# Patient Record
Sex: Female | Born: 1970 | Race: Black or African American | Hispanic: No | Marital: Single | Smoking: Current some day smoker
Health system: Southern US, Community
[De-identification: ages and names within clinical notes are randomized; demographics above are authoritative.]

## PROBLEM LIST (undated history)

## (undated) DIAGNOSIS — I10 Essential (primary) hypertension: Secondary | ICD-10-CM

## (undated) DIAGNOSIS — R519 Headache, unspecified: Secondary | ICD-10-CM

## (undated) DIAGNOSIS — R51 Headache: Secondary | ICD-10-CM

## (undated) HISTORY — DX: Essential (primary) hypertension: I10

## (undated) HISTORY — PX: FOOT SURGERY: SHX648

## (undated) HISTORY — DX: Headache, unspecified: R51.9

## (undated) HISTORY — PX: TUBAL LIGATION: SHX77

## (undated) HISTORY — DX: Headache: R51

---

## 2009-01-26 ENCOUNTER — Emergency Department (HOSPITAL_COMMUNITY): Admission: EM | Admit: 2009-01-26 | Discharge: 2009-01-26 | Payer: Self-pay | Admitting: Family Medicine

## 2009-03-09 ENCOUNTER — Observation Stay (HOSPITAL_COMMUNITY): Admission: EM | Admit: 2009-03-09 | Discharge: 2009-03-10 | Payer: Self-pay | Admitting: Emergency Medicine

## 2009-03-12 ENCOUNTER — Emergency Department (HOSPITAL_COMMUNITY): Admission: EM | Admit: 2009-03-12 | Discharge: 2009-03-12 | Payer: Self-pay | Admitting: Emergency Medicine

## 2009-03-23 ENCOUNTER — Emergency Department (HOSPITAL_COMMUNITY): Admission: EM | Admit: 2009-03-23 | Discharge: 2009-03-23 | Payer: Self-pay | Admitting: Emergency Medicine

## 2009-11-03 ENCOUNTER — Ambulatory Visit (HOSPITAL_COMMUNITY): Admission: RE | Admit: 2009-11-03 | Discharge: 2009-11-03 | Payer: Self-pay | Admitting: Obstetrics and Gynecology

## 2010-01-26 ENCOUNTER — Emergency Department (HOSPITAL_COMMUNITY): Admission: EM | Admit: 2010-01-26 | Discharge: 2010-01-26 | Payer: Self-pay | Admitting: Family Medicine

## 2010-02-17 ENCOUNTER — Emergency Department (HOSPITAL_COMMUNITY): Admission: EM | Admit: 2010-02-17 | Discharge: 2010-02-17 | Payer: Self-pay | Admitting: Emergency Medicine

## 2010-02-22 ENCOUNTER — Emergency Department (HOSPITAL_COMMUNITY): Admission: EM | Admit: 2010-02-22 | Discharge: 2010-02-23 | Payer: Self-pay | Admitting: Emergency Medicine

## 2011-02-21 LAB — WET PREP, GENITAL
Trich, Wet Prep: NONE SEEN
Yeast Wet Prep HPF POC: NONE SEEN

## 2011-02-21 LAB — GC/CHLAMYDIA PROBE AMP, GENITAL: GC Probe Amp, Genital: NEGATIVE

## 2011-02-21 LAB — URINALYSIS, ROUTINE W REFLEX MICROSCOPIC
Nitrite: NEGATIVE
Protein, ur: NEGATIVE mg/dL
pH: 7.5 (ref 5.0–8.0)

## 2011-02-21 LAB — URINE MICROSCOPIC-ADD ON

## 2011-03-01 LAB — CBC
HCT: 42.8 % (ref 36.0–46.0)
MCHC: 33.3 g/dL (ref 30.0–36.0)
MCV: 94.9 fL (ref 78.0–100.0)
RBC: 4.51 MIL/uL (ref 3.87–5.11)

## 2011-03-01 LAB — PREGNANCY, URINE: Preg Test, Ur: NEGATIVE

## 2011-03-09 LAB — POCT CARDIAC MARKERS
Myoglobin, poc: 58.7 ng/mL (ref 12–200)
Troponin i, poc: <0.05 ng/mL (ref 0.00–0.09)

## 2011-03-09 LAB — D-DIMER, QUANTITATIVE: D-Dimer, Quant: 0.47 ug/mL-FEU (ref 0.00–0.48)

## 2011-03-10 LAB — POCT URINALYSIS DIP (DEVICE): Bilirubin Urine: NEGATIVE

## 2011-03-10 LAB — POCT PREGNANCY, URINE: Preg Test, Ur: NEGATIVE

## 2012-01-03 ENCOUNTER — Other Ambulatory Visit: Payer: Self-pay | Admitting: Internal Medicine

## 2012-01-03 DIAGNOSIS — E049 Nontoxic goiter, unspecified: Secondary | ICD-10-CM

## 2012-01-06 ENCOUNTER — Ambulatory Visit
Admission: RE | Admit: 2012-01-06 | Discharge: 2012-01-06 | Disposition: A | Discharge status: Home / Self Care | Payer: Medicaid Other | Source: Ambulatory Visit | Attending: Internal Medicine | Admitting: Internal Medicine

## 2012-01-06 DIAGNOSIS — E049 Nontoxic goiter, unspecified: Secondary | ICD-10-CM

## 2013-05-11 ENCOUNTER — Emergency Department (HOSPITAL_COMMUNITY)
Admission: EM | Admit: 2013-05-11 | Discharge: 2013-05-11 | Disposition: A | Discharge status: Home / Self Care | Payer: Medicaid Other | Attending: Emergency Medicine | Admitting: Emergency Medicine

## 2013-05-11 ENCOUNTER — Encounter (HOSPITAL_COMMUNITY): Payer: Self-pay | Admitting: Emergency Medicine

## 2013-05-11 DIAGNOSIS — L299 Pruritus, unspecified: Secondary | ICD-10-CM | POA: Insufficient documentation

## 2013-05-11 DIAGNOSIS — R21 Rash and other nonspecific skin eruption: Secondary | ICD-10-CM

## 2013-05-11 DIAGNOSIS — J3489 Other specified disorders of nose and nasal sinuses: Secondary | ICD-10-CM | POA: Insufficient documentation

## 2013-05-11 DIAGNOSIS — F17200 Nicotine dependence, unspecified, uncomplicated: Secondary | ICD-10-CM | POA: Insufficient documentation

## 2013-05-11 MED ORDER — HYDROCORTISONE 1 % EX CREA: TOPICAL_CREAM | Freq: Two times a day (BID) | CUTANEOUS | Status: DC

## 2013-05-11 MED ORDER — MUPIROCIN CALCIUM 2 % EX CREA: TOPICAL_CREAM | Freq: Three times a day (TID) | CUTANEOUS | Status: DC

## 2013-05-11 NOTE — ED Provider Notes (Signed)
History     CSN: [id number]  Arrival date & time 05/11/13  8657   First MD Initiated Contact with Patient 05/11/13 [phone]      Chief Complaint  Patient presents with  . Rash    (Consider location/radiation/quality/duration/timing/severity/associated sxs/prior treatment) HPI Comments: Patient is a 42 year old female who presents today with a pruritic rash since Memorial Day. The rash in on the dorsal aspect of her right hand and has been worsening. She states it initially looked like ringworm and she was around one of her niece's who's sister had ringworm. She self treated with an antifungal cream. She initially was improving, but 2 days ago the rash increased in size and became more itchy. She has been itching at it, putting band aids over it, and using distraction techniques all without improvement. She has a small area that is itchy on her upper left arm and upper right arm as well. She admits to congestion. No fevers, chills, nausea, vomiting, abdominal pain, arthralgias.   The history is provided by the patient. No language interpreter was used.    History reviewed. No pertinent past medical history.  Past Surgical History  Procedure Laterality Date  . Tubal ligation      No family history on file.  History  Substance Use Topics  . Smoking status: Current Every Day Smoker  . Smokeless tobacco: Not on file  . Alcohol Use: Yes     Comment: occasional    OB History   Grav Para Term Preterm Abortions TAB SAB Ect Mult Living                  Review of Systems  Constitutional: Negative for fever and chills.  HENT: Positive for congestion.   Gastrointestinal: Negative for vomiting and abdominal pain.  Musculoskeletal: Negative for joint swelling, arthralgias and gait problem.  Skin: Positive for rash.  All other systems reviewed and are negative.    Allergies  Morphine and related  Home Medications  No current outpatient prescriptions on file.  BP 132/82  Pulse  109  Temp(Src) 98.4 F (36.9 C) (Oral)  Resp 20  SpO2 100%  Physical Exam  Nursing note and vitals reviewed. Constitutional: She is oriented to person, place, and time. She appears well-developed and well-nourished. No distress.  HENT:  Head: Normocephalic and atraumatic.  Right Ear: External ear and ear canal normal.  Left Ear: Tympanic membrane, external ear and ear canal normal.  Nose: Nose normal.  Mouth/Throat: Oropharynx is clear and moist.  Eyes: Conjunctivae are normal.  Neck: Normal range of motion.  Cardiovascular: Normal rate, regular rhythm and normal heart sounds.   Pulmonary/Chest: Effort normal and breath sounds normal. No stridor. No respiratory distress. She has no wheezes. She has no rales.  Abdominal: Soft. She exhibits no distension.  Musculoskeletal: Normal range of motion.  Neurological: She is alert and oriented to person, place, and time. She has normal strength.  Skin: Skin is warm and dry. She is not diaphoretic. No erythema.  2 cm circular area of raw excortication with surrounding pustules  1 cm area of erythema on right proximal medial arm; 1 cm area of erythema on left proximal lateral arm  Psychiatric: She has a normal mood and affect. Her behavior is normal.    ED Course  Procedures (including critical care time)  Labs Reviewed - No data to display No results found.   1. Rash       MDM  No evidence of SJS  or necrotizing fasciitis. Due to pruritic and not painful nature of blisters do not suspect pemphigus vulgaris. Pustules do not resemble scabies as per pt hx or allergic reaction. No warmth, no draining sinus tracts, no superficial abscesses, no bullous impetigo, no vesicles, no desquamation, no target lesions with dusky purpura or a central bulla. Not tender to touch. Given hydrocortisone cream and Bactroban. Follow up with PCP if symptoms do not improve. Return instructions given.          Mora Bellman, PA-C 05/12/13 2110

## 2013-05-11 NOTE — ED Notes (Signed)
Pt c/o rash to right middle finger and right upper arm onset Memorial Day weekend. Pt originally thought it was ringworm and has been using creams but areas getting worse.

## 2013-05-13 NOTE — ED Provider Notes (Signed)
Medical screening examination/treatment/procedure(s) were performed by non-physician practitioner and as supervising physician I was immediately available for consultation/collaboration.   Glynn Octave, MD 05/13/13 1121

## 2014-02-26 ENCOUNTER — Ambulatory Visit (HOSPITAL_COMMUNITY)
Admission: RE | Admit: 2014-02-26 | Discharge: 2014-02-26 | Disposition: A | Discharge status: Home / Self Care | Payer: Medicaid Other | Source: Ambulatory Visit | Attending: Internal Medicine | Admitting: Internal Medicine

## 2014-02-26 ENCOUNTER — Other Ambulatory Visit (HOSPITAL_COMMUNITY): Payer: Self-pay | Admitting: Internal Medicine

## 2014-02-26 DIAGNOSIS — T148XXA Other injury of unspecified body region, initial encounter: Secondary | ICD-10-CM

## 2014-02-26 DIAGNOSIS — X500XXA Overexertion from strenuous movement or load, initial encounter: Secondary | ICD-10-CM | POA: Insufficient documentation

## 2014-02-26 DIAGNOSIS — M25579 Pain in unspecified ankle and joints of unspecified foot: Secondary | ICD-10-CM | POA: Insufficient documentation

## 2014-04-10 ENCOUNTER — Other Ambulatory Visit: Payer: Self-pay | Admitting: Obstetrics and Gynecology

## 2014-04-10 DIAGNOSIS — R928 Other abnormal and inconclusive findings on diagnostic imaging of breast: Secondary | ICD-10-CM

## 2014-04-22 ENCOUNTER — Ambulatory Visit
Admission: RE | Admit: 2014-04-22 | Discharge: 2014-04-22 | Disposition: A | Discharge status: Home / Self Care | Payer: Medicaid Other | Source: Ambulatory Visit | Attending: Obstetrics and Gynecology | Admitting: Obstetrics and Gynecology

## 2014-04-22 DIAGNOSIS — R928 Other abnormal and inconclusive findings on diagnostic imaging of breast: Secondary | ICD-10-CM

## 2014-09-11 DIAGNOSIS — Z79899 Other long term (current) drug therapy: Secondary | ICD-10-CM | POA: Insufficient documentation

## 2014-09-11 DIAGNOSIS — R0789 Other chest pain: Secondary | ICD-10-CM | POA: Insufficient documentation

## 2014-09-11 DIAGNOSIS — R079 Chest pain, unspecified: Secondary | ICD-10-CM | POA: Diagnosis present

## 2014-09-11 DIAGNOSIS — Z720 Tobacco use: Secondary | ICD-10-CM | POA: Insufficient documentation

## 2014-09-12 ENCOUNTER — Emergency Department (HOSPITAL_COMMUNITY): Payer: Medicaid Other

## 2014-09-12 ENCOUNTER — Encounter (HOSPITAL_COMMUNITY): Payer: Self-pay | Admitting: Emergency Medicine

## 2014-09-12 ENCOUNTER — Emergency Department (HOSPITAL_COMMUNITY)
Admission: EM | Admit: 2014-09-12 | Discharge: 2014-09-12 | Disposition: A | Discharge status: Home / Self Care | Payer: Medicaid Other | Attending: Emergency Medicine | Admitting: Emergency Medicine

## 2014-09-12 DIAGNOSIS — R0789 Other chest pain: Secondary | ICD-10-CM

## 2014-09-12 LAB — CBC
HCT: 40.9 % (ref 36.0–46.0)
HEMOGLOBIN: 14 g/dL (ref 12.0–15.0)
MCH: 31 pg (ref 26.0–34.0)
MCHC: 34.2 g/dL (ref 30.0–36.0)
MCV: 90.5 fL (ref 78.0–100.0)
Platelets: 309 10*3/uL (ref 150–400)
RBC: 4.52 MIL/uL (ref 3.87–5.11)
RDW: 12.3 % (ref 11.5–15.5)
WBC: 10.7 10*3/uL — AB (ref 4.0–10.5)

## 2014-09-12 LAB — BASIC METABOLIC PANEL
Anion gap: 10 (ref 5–15)
BUN: 7 mg/dL (ref 6–23)
CHLORIDE: 99 meq/L (ref 96–112)
CO2: 31 meq/L (ref 19–32)
Calcium: 8.8 mg/dL (ref 8.4–10.5)
Creatinine, Ser: 0.84 mg/dL (ref 0.50–1.10)
GFR calc non Af Amer: 85 mL/min — ABNORMAL LOW (ref >90)
Glucose, Bld: 101 mg/dL — ABNORMAL HIGH (ref 70–99)
Potassium: 4 mEq/L (ref 3.7–5.3)
Sodium: 140 mEq/L (ref 137–147)

## 2014-09-12 LAB — I-STAT TROPONIN, ED: Troponin i, poc: 0.01 ng/mL (ref 0.00–0.08)

## 2014-09-12 LAB — D-DIMER, QUANTITATIVE (NOT AT ARMC): D DIMER QUANT: 0.37 ug{FEU}/mL (ref 0.00–0.48)

## 2014-09-12 MED ORDER — KETOROLAC TROMETHAMINE 60 MG/2ML IM SOLN
30.0000 mg | Freq: Once | INTRAMUSCULAR | Status: AC
  Administered 2014-09-12: 30 mg via INTRAMUSCULAR
  Filled 2014-09-12: qty 2

## 2014-09-12 MED ORDER — MELOXICAM 7.5 MG PO TABS: 15.0000 mg | ORAL_TABLET | Freq: Every day | ORAL | Status: DC

## 2014-09-12 MED ORDER — HYDROCODONE-ACETAMINOPHEN 5-325 MG PO TABS: 1.0000 | ORAL_TABLET | Freq: Four times a day (QID) | ORAL | Status: DC | PRN

## 2014-09-12 NOTE — ED Provider Notes (Signed)
Medical screening examination/treatment/procedure(s) were performed by non-physician practitioner and as supervising physician I was immediately available for consultation/collaboration.   EKG Interpretation None        Christy MoMatthew Dimitria Ketchum, MD 09/12/14 [phone]

## 2014-09-12 NOTE — ED Provider Notes (Signed)
CSN: [id number]     Arrival date & time 09/11/14  2352 History   First MD Initiated Contact with Patient 09/12/14 0053     Chief Complaint  Patient presents with  . Chest Pain     (Consider location/radiation/quality/duration/timing/severity/associated sxs/prior Treatment) HPI Comments: Patient is a 43 year old female past medical history significant for tobacco abuse presenting to the emergency department for acute onset chest pain with occasional radiation into her left arm that began at 5:30 this morning. Patient states pain did not awake her from sleep. She describes as a occasionally burning in sensation and other times sharp pain. She has not taken any medications to help with her symptoms. Patient states movement aggravates her pain. She denies any fevers, chills, nausea, vomiting, diaphoresis, shortness of breath, unilateral leg swelling. No early familial cardiac history. No history of echocardiogram, stress test, cardiac catheterizations.   History reviewed. No pertinent past medical history. Past Surgical History  Procedure Laterality Date  . Tubal ligation     No family history on file. History  Substance Use Topics  . Smoking status: Current Every Day Smoker  . Smokeless tobacco: Not on file  . Alcohol Use: Yes     Comment: occasional   OB History   Grav Para Term Preterm Abortions TAB SAB Ect Mult Living                 Review of Systems  Constitutional: Negative for fever and chills.  Cardiovascular: Positive for chest pain.  All other systems reviewed and are negative.     Allergies  Morphine and related  Home Medications   Prior to Admission medications   Medication Sig Start Date End Date Taking? Authorizing Provider  albuterol (PROVENTIL HFA;VENTOLIN HFA) 108 (90 BASE) MCG/ACT inhaler Inhale 2 puffs into the lungs every 6 (six) hours as needed for wheezing or shortness of breath.   Yes Historical Provider, MD   BP 127/91  Pulse 86  Temp(Src) 98.4  F (36.9 C)  Resp 15  Wt 182 lb (82.555 kg)  SpO2 98% Physical Exam  Nursing note and vitals reviewed. Constitutional: She is oriented to person, place, and time. She appears well-developed and well-nourished. No distress.  HENT:  Head: Normocephalic and atraumatic.  Right Ear: External ear normal.  Left Ear: External ear normal.  Nose: Nose normal.  Mouth/Throat: Oropharynx is clear and moist. No oropharyngeal exudate.  Eyes: Conjunctivae are normal.  Neck: Neck supple.  Cardiovascular: Normal rate, regular rhythm and normal heart sounds.   Pulmonary/Chest: Effort normal and breath sounds normal. No respiratory distress. She exhibits tenderness.  Abdominal: Soft. There is no tenderness.  Musculoskeletal: Normal range of motion. She exhibits no edema.  Neurological: She is alert and oriented to person, place, and time.  Skin: Skin is warm and dry. No rash noted. She is not diaphoretic.    ED Course  Procedures (including critical care time) Medications  ketorolac (TORADOL) injection 30 mg (30 mg Intramuscular Given 09/12/14 0219)    Labs Review Labs Reviewed  CBC - Abnormal; Notable for the following:    WBC 10.7 (*)    All other components within normal limits  BASIC METABOLIC PANEL - Abnormal; Notable for the following:    Glucose, Bld 101 (*)    GFR calc non Af Amer 85 (*)    All other components within normal limits  D-DIMER, QUANTITATIVE  Rosezena SensorI-STAT TROPOININ, ED    Imaging Review Dg Chest 2 View  09/12/2014   CLINICAL DATA:  Chest pain.  Initial encounter  EXAM: CHEST  2 VIEW  COMPARISON:  03/23/2009  FINDINGS: Normal heart size and mediastinal contours. No acute infiltrate or edema. No effusion or pneumothorax. No acute osseous findings.  IMPRESSION: No active cardiopulmonary disease.   Electronically Signed   By: Tiburcio PeaJonathan  Watts M.D.   On: 09/12/2014 01:47     EKG Interpretation None        Heart Score 1   MDM   Final diagnoses:  Chest pain, atypical     Filed Vitals:   09/12/14 0230  BP: 127/91  Pulse: 86  Temp:   Resp:    Afebrile, NAD, non-toxic appearing, AAOx4.   Patient is to be discharged with recommendation to follow up with PCP in regards to today's hospital visit. Chest pain is not likely of cardiac or pulmonary etiology d/t presentation, d-dimer negative, VSS, no tracheal deviation, no JVD or new murmur, RRR, breath sounds equal bilaterally, EKG without acute abnormalities, negative troponin, and negative CXR. Pt has been advised start a PPI and return to the ED is CP becomes exertional, associated with diaphoresis or nausea, radiates to left jaw/arm, worsens or becomes concerning in any way. Pt appears reliable for follow up and is agreeable to discharge.   Case has been discussed with by Dr. Littie DeedsGentry who agrees with the above plan to discharge.      Jeannetta EllisJennifer L Petros Ahart, PA-C 09/12/14 [phone]

## 2014-09-12 NOTE — ED Notes (Signed)
Pt. reports mid chest pain onset this morning radiating to upper back with emesis , denies SOB or diaphoresis.

## 2014-09-12 NOTE — Discharge Instructions (Signed)
Please follow up with your primary care physician in 1-2 days. If you do not have one please call the [facility] number listed above. Please take pain medication and/or muscle relaxants as prescribed and as needed for pain. Please do not drive on narcotic pain medication or on muscle relaxants. Please read all discharge instructions and return precautions.  ° ° °Chest Wall Pain °Chest wall pain is pain in or around the bones and muscles of your chest. It may take up to 6 weeks to get better. It may take longer if you must stay physically active in your work and activities.  °CAUSES  °Chest wall pain may happen on its own. However, it may be caused by: °· A viral illness like the flu. °· Injury. °· Coughing. °· Exercise. °· Arthritis. °· Fibromyalgia. °· Shingles. °HOME CARE INSTRUCTIONS  °· Avoid overtiring physical activity. Try not to strain or perform activities that cause pain. This includes any activities using your chest or your abdominal and side muscles, especially if heavy weights are used. °· Put ice on the sore area. °¨ Put ice in a plastic bag. °¨ Place a towel between your skin and the bag. °¨ Leave the ice on for 15-20 minutes per hour while awake for the first 2 days. °· Only take over-the-counter or prescription medicines for pain, discomfort, or fever as directed by your caregiver. °SEEK IMMEDIATE MEDICAL CARE IF:  °· Your pain increases, or you are very uncomfortable. °· You have a fever. °· Your chest pain becomes worse. °· You have new, unexplained symptoms. °· You have nausea or vomiting. °· You feel sweaty or lightheaded. °· You have a cough with phlegm (sputum), or you cough up blood. °MAKE SURE YOU:  °· Understand these instructions. °· Will watch your condition. °· Will get help right away if you are not doing well or get worse. °Document Released: 11/14/2005 Document Revised: 02/06/2012 Document Reviewed: 07/11/2011 °[employer]® Patient Information ©2015 [employer].  This information is not intended to replace advice given to you by your health care provider. Make sure you discuss any questions you have with your health care provider. ° °

## 2015-03-05 ENCOUNTER — Emergency Department (HOSPITAL_COMMUNITY)
Admission: EM | Admit: 2015-03-05 | Discharge: 2015-03-05 | Disposition: A | Discharge status: Home / Self Care | Payer: Medicaid Other | Attending: Emergency Medicine | Admitting: Emergency Medicine

## 2015-03-05 ENCOUNTER — Encounter (HOSPITAL_COMMUNITY): Payer: Self-pay

## 2015-03-05 DIAGNOSIS — Z9851 Tubal ligation status: Secondary | ICD-10-CM | POA: Insufficient documentation

## 2015-03-05 DIAGNOSIS — Z720 Tobacco use: Secondary | ICD-10-CM | POA: Diagnosis not present

## 2015-03-05 DIAGNOSIS — Z791 Long term (current) use of non-steroidal anti-inflammatories (NSAID): Secondary | ICD-10-CM | POA: Insufficient documentation

## 2015-03-05 DIAGNOSIS — B9689 Other specified bacterial agents as the cause of diseases classified elsewhere: Secondary | ICD-10-CM

## 2015-03-05 DIAGNOSIS — N760 Acute vaginitis: Secondary | ICD-10-CM | POA: Insufficient documentation

## 2015-03-05 DIAGNOSIS — R1030 Lower abdominal pain, unspecified: Secondary | ICD-10-CM | POA: Diagnosis present

## 2015-03-05 LAB — CBC WITH DIFFERENTIAL/PLATELET
Basophils Absolute: 0 10*3/uL (ref 0.0–0.1)
Basophils Relative: 1 % (ref 0–1)
Eosinophils Absolute: 0.1 10*3/uL (ref 0.0–0.7)
Eosinophils Relative: 2 % (ref 0–5)
HCT: 44.2 % (ref 36.0–46.0)
Hemoglobin: 14.6 g/dL (ref 12.0–15.0)
LYMPHS ABS: 3.4 10*3/uL (ref 0.7–4.0)
LYMPHS PCT: 45 % (ref 12–46)
MCH: 31 pg (ref 26.0–34.0)
MCHC: 33 g/dL (ref 30.0–36.0)
MCV: 93.8 fL (ref 78.0–100.0)
Monocytes Absolute: 0.8 10*3/uL (ref 0.1–1.0)
Monocytes Relative: 11 % (ref 3–12)
NEUTROS ABS: 3.1 10*3/uL (ref 1.7–7.7)
NEUTROS PCT: 41 % — AB (ref 43–77)
PLATELETS: 273 10*3/uL (ref 150–400)
RBC: 4.71 MIL/uL (ref 3.87–5.11)
RDW: 12.7 % (ref 11.5–15.5)
WBC: 7.5 10*3/uL (ref 4.0–10.5)

## 2015-03-05 LAB — URINALYSIS, ROUTINE W REFLEX MICROSCOPIC
BILIRUBIN URINE: NEGATIVE
GLUCOSE, UA: NEGATIVE mg/dL
Hgb urine dipstick: NEGATIVE
KETONES UR: NEGATIVE mg/dL
Leukocytes, UA: NEGATIVE
Nitrite: NEGATIVE
PH: 7.5 (ref 5.0–8.0)
Protein, ur: NEGATIVE mg/dL
SPECIFIC GRAVITY, URINE: 1.005 (ref 1.005–1.030)
UROBILINOGEN UA: 0.2 mg/dL (ref 0.0–1.0)

## 2015-03-05 LAB — COMPREHENSIVE METABOLIC PANEL
ALK PHOS: 72 U/L (ref 39–117)
ALT: 9 U/L (ref 0–35)
ANION GAP: 14 (ref 5–15)
AST: 17 U/L (ref 0–37)
Albumin: 3.8 g/dL (ref 3.5–5.2)
CO2: 22 mmol/L (ref 19–32)
Calcium: 9.3 mg/dL (ref 8.4–10.5)
Chloride: 104 mmol/L (ref 96–112)
Creatinine, Ser: 0.86 mg/dL (ref 0.50–1.10)
GFR calc non Af Amer: 82 mL/min — ABNORMAL LOW (ref >90)
GLUCOSE: 95 mg/dL (ref 70–99)
POTASSIUM: 4.5 mmol/L (ref 3.5–5.1)
Sodium: 140 mmol/L (ref 135–145)
Total Bilirubin: 0.5 mg/dL (ref 0.3–1.2)
Total Protein: 7 g/dL (ref 6.0–8.3)

## 2015-03-05 LAB — WET PREP, GENITAL
Trich, Wet Prep: NONE SEEN
Yeast Wet Prep HPF POC: NONE SEEN

## 2015-03-05 MED ORDER — AZITHROMYCIN 250 MG PO TABS
1000.0000 mg | ORAL_TABLET | Freq: Once | ORAL | Status: AC
  Administered 2015-03-05: 1000 mg via ORAL
  Filled 2015-03-05: qty 4

## 2015-03-05 MED ORDER — METRONIDAZOLE 500 MG PO TABS: 500.0000 mg | ORAL_TABLET | Freq: Two times a day (BID) | ORAL | Status: DC

## 2015-03-05 MED ORDER — LIDOCAINE HCL (PF) 1 % IJ SOLN
5.0000 mL | Freq: Once | INTRAMUSCULAR | Status: AC
  Administered 2015-03-05: 5 mL
  Filled 2015-03-05: qty 5

## 2015-03-05 MED ORDER — CEFTRIAXONE SODIUM 250 MG IJ SOLR
250.0000 mg | Freq: Once | INTRAMUSCULAR | Status: AC
  Administered 2015-03-05: 250 mg via INTRAMUSCULAR
  Filled 2015-03-05: qty 250

## 2015-03-05 NOTE — ED Provider Notes (Signed)
CSN: [id number]     Arrival date & time 03/05/15  1003 History   First MD Initiated Contact with Patient 03/05/15 1012     No chief complaint on file.    (Consider location/radiation/quality/duration/timing/severity/associated sxs/prior Treatment) HPI Comments: Patient presents today with a chief complaint of lower abdominal pain.  Pain predominately in the suprapubic area and does not radiate.  She states that the pain has been intermittent over the past 6 days, but has been occurring more frequently since yesterday.  She states that she had similar pain one month ago, but pain resolved and was never evaluated by a physician for the pain.  She states that the pain is associated with urinary frequency and also a thick whitish odorous vaginal discharge.  She has not taken anything for pain prior to arrival.  Denies nausea, vomiting, diarrhea, constipation, fever, or chills.  PMH significant for tubal fulguration.      The history is provided by the patient.    History reviewed. No pertinent past medical history. Past Surgical History  Procedure Laterality Date  . Tubal ligation     History reviewed. No pertinent family history. History  Substance Use Topics  . Smoking status: Current Every Day Smoker  . Smokeless tobacco: Not on file  . Alcohol Use: Yes     Comment: occasional   OB History    No data available     Review of Systems  All other systems reviewed and are negative.     Allergies  Morphine and related  Home Medications   Prior to Admission medications   Medication Sig Start Date End Date Taking? Authorizing Provider  albuterol (PROVENTIL HFA;VENTOLIN HFA) 108 (90 BASE) MCG/ACT inhaler Inhale 2 puffs into the lungs every 6 (six) hours as needed for wheezing or shortness of breath.    Historical Provider, MD  HYDROcodone-acetaminophen (NORCO/VICODIN) 5-325 MG per tablet Take 1-2 tablets by mouth every 6 (six) hours as needed for severe pain. 09/12/14   Jennifer  Piepenbrink, PA-C  meloxicam (MOBIC) 7.5 MG tablet Take 2 tablets (15 mg total) by mouth daily. 09/12/14   Jennifer Piepenbrink, PA-C   BP 149/98 mmHg  Pulse 102  Temp(Src) 98.3 F (36.8 C) (Oral)  Resp 18  SpO2 99% Physical Exam  Constitutional: She appears well-developed and well-nourished.  HENT:  Head: Normocephalic and atraumatic.  Mouth/Throat: Oropharynx is clear and moist.  Neck: Normal range of motion. Neck supple.  Cardiovascular: Normal rate, regular rhythm and normal heart sounds.   Pulmonary/Chest: Effort normal and breath sounds normal.  Abdominal: Soft. Bowel sounds are normal. There is tenderness in the suprapubic area. There is no rigidity, no rebound and no guarding.  Genitourinary: Cervix exhibits no motion tenderness. Right adnexum displays no mass, no tenderness and no fullness. Left adnexum displays no mass, no tenderness and no fullness.  Thick whitish colored discharge in the vaginal vault  Neurological: She is alert.  Skin: Skin is warm and dry.  Psychiatric: She has a normal mood and affect.  Nursing note and vitals reviewed.   ED Course  Procedures (including critical care time) Labs Review Labs Reviewed  WET PREP, GENITAL  CBC WITH DIFFERENTIAL/PLATELET  COMPREHENSIVE METABOLIC PANEL  URINALYSIS, ROUTINE W REFLEX MICROSCOPIC  HIV ANTIBODY (ROUTINE TESTING)  GC/CHLAMYDIA PROBE AMP ([place])    Imaging Review No results found.   EKG Interpretation None      MDM   Final diagnoses:  None   Patient presents today with pain in the  suprapubic area and also a whitish colored vaginal discharge.  Abdomen is soft with only mild suprapubic tenderness.  No rebound or guarding.  Afebrile.  No CMT or adnexal tenderness with pelvic exam.  Labs unremarkable.  GC/Chlamydia pending.  Patient given prophylactic treatment with Ceftriaxone and Azithromycin.  Wet prep showing many clue cells consistent with BV.  Patient treated with Flagyl.  Patient stable  for discharge.  Return precautions given.      Santiago Glad, PA-C 03/07/15 2032  Vanetta Mulders, MD 03/09/15 [phone]

## 2015-03-05 NOTE — ED Notes (Signed)
Pt reports lower abdominal pain with a thick white discharge that is malodorous.  Pt denies any itching or burning and denies dysuria.  Pt denies n/v/d and fever.

## 2015-03-06 LAB — HIV ANTIBODY (ROUTINE TESTING W REFLEX): HIV Screen 4th Generation wRfx: NONREACTIVE

## 2015-03-06 LAB — GC/CHLAMYDIA PROBE AMP (~~LOC~~) NOT AT ARMC
Chlamydia: NEGATIVE
Neisseria Gonorrhea: POSITIVE — AB

## 2015-03-09 ENCOUNTER — Telehealth (HOSPITAL_COMMUNITY): Payer: Self-pay | Admitting: *Deleted

## 2015-04-14 ENCOUNTER — Encounter (HOSPITAL_COMMUNITY): Payer: Self-pay | Admitting: Emergency Medicine

## 2015-04-14 ENCOUNTER — Emergency Department (HOSPITAL_COMMUNITY)
Admission: EM | Admit: 2015-04-14 | Discharge: 2015-04-15 | Disposition: A | Discharge status: Home / Self Care | Payer: Medicaid Other | Attending: Emergency Medicine | Admitting: Emergency Medicine

## 2015-04-14 DIAGNOSIS — G43909 Migraine, unspecified, not intractable, without status migrainosus: Secondary | ICD-10-CM

## 2015-04-14 DIAGNOSIS — Z792 Long term (current) use of antibiotics: Secondary | ICD-10-CM | POA: Diagnosis not present

## 2015-04-14 DIAGNOSIS — R51 Headache: Secondary | ICD-10-CM | POA: Diagnosis present

## 2015-04-14 DIAGNOSIS — Z720 Tobacco use: Secondary | ICD-10-CM | POA: Insufficient documentation

## 2015-04-14 DIAGNOSIS — Z79899 Other long term (current) drug therapy: Secondary | ICD-10-CM | POA: Insufficient documentation

## 2015-04-14 DIAGNOSIS — G43009 Migraine without aura, not intractable, without status migrainosus: Secondary | ICD-10-CM | POA: Insufficient documentation

## 2015-04-14 DIAGNOSIS — Z791 Long term (current) use of non-steroidal anti-inflammatories (NSAID): Secondary | ICD-10-CM | POA: Diagnosis not present

## 2015-04-14 MED ORDER — SODIUM CHLORIDE 0.9 % IV BOLUS (SEPSIS)
1000.0000 mL | Freq: Once | INTRAVENOUS | Status: AC
  Administered 2015-04-14: 1000 mL via INTRAVENOUS

## 2015-04-14 NOTE — ED Notes (Signed)
Pt. reports chronic headaches for 2 months unrelieved by OTC pain medications . Denies head injury , no nausea or vomitting .

## 2015-04-15 ENCOUNTER — Emergency Department (HOSPITAL_COMMUNITY): Payer: Medicaid Other

## 2015-04-15 MED ORDER — PROCHLORPERAZINE EDISYLATE 5 MG/ML IJ SOLN
10.0000 mg | Freq: Once | INTRAMUSCULAR | Status: AC
  Administered 2015-04-15: 10 mg via INTRAVENOUS
  Filled 2015-04-15: qty 2

## 2015-04-15 MED ORDER — DIPHENHYDRAMINE HCL 50 MG/ML IJ SOLN
25.0000 mg | Freq: Once | INTRAMUSCULAR | Status: AC
  Administered 2015-04-15: 25 mg via INTRAVENOUS
  Filled 2015-04-15: qty 1

## 2015-04-15 MED ORDER — IBUPROFEN 800 MG PO TABS: 800.0000 mg | ORAL_TABLET | Freq: Three times a day (TID) | ORAL | Status: DC | PRN

## 2015-04-15 MED ORDER — KETOROLAC TROMETHAMINE 30 MG/ML IJ SOLN
30.0000 mg | Freq: Once | INTRAMUSCULAR | Status: AC
  Administered 2015-04-15: 30 mg via INTRAVENOUS
  Filled 2015-04-15: qty 1

## 2015-04-15 MED ORDER — DEXAMETHASONE SODIUM PHOSPHATE 10 MG/ML IJ SOLN
10.0000 mg | Freq: Once | INTRAMUSCULAR | Status: AC
  Administered 2015-04-15: 10 mg via INTRAVENOUS
  Filled 2015-04-15: qty 1

## 2015-04-15 NOTE — ED Provider Notes (Signed)
CSN: [id number]     Arrival date & time 04/14/15  1938 History   First MD Initiated Contact with Patient 04/14/15 2257     Chief Complaint  Patient presents with  . Headache     (Consider location/radiation/quality/duration/timing/severity/associated sxs/prior Treatment) HPI Patient presents to the emergency department with headache and it has been ongoing for the last month and a half it has been intermittent.  The patient states that she took Tylenol but seems help somewhat with her headache.  The patient states that she has not had any nausea, vomiting, diarrhea, abdominal pain, chest pain, shortness of breath, weakness, dizziness, lightheadedness, fever, rash, cough, runny nose, sore throat, or syncope.  The patient states that nothing seems make her condition, better other than the Tylenol with slight improvement, but light and sound make her headache worse History reviewed. No pertinent past medical history. Past Surgical History  Procedure Laterality Date  . Tubal ligation     No family history on file. History  Substance Use Topics  . Smoking status: Current Every Day Smoker  . Smokeless tobacco: Not on file  . Alcohol Use: Yes     Comment: occasional   OB History    No data available     Review of Systems  All other systems negative except as documented in the HPI. All pertinent positives and negatives as reviewed in the HPI.=  Allergies  Other and Morphine and related  Home Medications   Prior to Admission medications   Medication Sig Start Date End Date Taking? Authorizing Provider  HYDROcodone-acetaminophen (NORCO/VICODIN) 5-325 MG per tablet Take 1-2 tablets by mouth every 6 (six) hours as needed for severe pain. 09/12/14   Jennifer Piepenbrink, PA-C  meloxicam (MOBIC) 7.5 MG tablet Take 2 tablets (15 mg total) by mouth daily. 09/12/14   Jennifer Piepenbrink, PA-C  metroNIDAZOLE (FLAGYL) 500 MG tablet Take 1 tablet (500 mg total) by mouth 2 (two) times daily.  03/05/15   Heather Laisure, PA-C   BP 148/97 mmHg  Pulse 98  Temp(Src) 98.3 F (36.8 C) (Oral)  Resp 14  Ht 5\' 4"  (1.626 m)  Wt 165 lb 12.8 oz (75.206 kg)  BMI 28.45 kg/m2  SpO2 100% Physical Exam  Constitutional: She is oriented to person, place, and time. She appears well-developed and well-nourished. No distress.  HENT:  Head: Normocephalic and atraumatic.  Mouth/Throat: Oropharynx is clear and moist.  Eyes: Pupils are equal, round, and reactive to light.  Neck: Normal range of motion. Neck supple.  Cardiovascular: Normal rate, regular rhythm and normal heart sounds.  Exam reveals no gallop and no friction rub.   No murmur heard. Pulmonary/Chest: Effort normal and breath sounds normal. No respiratory distress.  Musculoskeletal: She exhibits no edema.  Neurological: She is alert and oriented to person, place, and time. No cranial nerve deficit. She exhibits normal muscle tone. Coordination normal.  Skin: Skin is warm and dry. No rash noted. No erythema.  Psychiatric: She has a normal mood and affect. Her behavior is normal.  Nursing note and vitals reviewed.   ED Course  Procedures (including critical care time)  Imaging Review No results found.  Patient most likely has a migraine type headache.  She states she has had similar headaches in the past, but not to this extent where sitting in a dark room, does not help her headache.  Patient will be advised to follow-up with her primary care Dr. told to return here as needed     Charlestine Nighthristopher Mike Berntsen,  PA-C 04/15/15 [id number]  Devoria AlbeIva Knapp, MD 04/15/15 [phone]

## 2015-04-15 NOTE — Discharge Instructions (Signed)
Return here as needed.  Follow-up with the neurologist provided along with your primary care doctor, increase your fluid intake, rest as much possible

## 2015-05-08 ENCOUNTER — Encounter: Payer: Self-pay | Admitting: Neurology

## 2015-05-08 ENCOUNTER — Ambulatory Visit (INDEPENDENT_AMBULATORY_CARE_PROVIDER_SITE_OTHER): Payer: Medicaid Other | Admitting: Neurology

## 2015-05-08 VITALS — BP 120/74 | HR 87 | Ht 63.0 in | Wt 160.3 lb

## 2015-05-08 DIAGNOSIS — R202 Paresthesia of skin: Secondary | ICD-10-CM

## 2015-05-08 DIAGNOSIS — G43709 Chronic migraine without aura, not intractable, without status migrainosus: Secondary | ICD-10-CM | POA: Diagnosis not present

## 2015-05-08 DIAGNOSIS — Z720 Tobacco use: Secondary | ICD-10-CM | POA: Diagnosis not present

## 2015-05-08 DIAGNOSIS — R200 Anesthesia of skin: Secondary | ICD-10-CM

## 2015-05-08 DIAGNOSIS — G43809 Other migraine, not intractable, without status migrainosus: Secondary | ICD-10-CM

## 2015-05-08 MED ORDER — TOPIRAMATE 50 MG PO TABS: ORAL_TABLET | ORAL | Status: DC

## 2015-05-08 MED ORDER — SUMATRIPTAN SUCCINATE 100 MG PO TABS: ORAL_TABLET | ORAL | Status: DC

## 2015-05-08 NOTE — Progress Notes (Signed)
NEUROLOGY CONSULTATION NOTE  Raechell Singleton MRN: [id number] DOB: [birth year]  Referring provider: Charlestine Night, PA-C (from ED) Primary care provider: Dr. Concepcion Elk  Reason for consult:  headache  HISTORY OF PRESENT ILLNESS: Christy Leblanc is a 44 year old right-handed woman who is a current smoker who presents for headache.  ED records and head CT reviewed.  Onset:  Since 44 years old following a head injury with mild concussion.  Cans in a grocery store fell on her head. Location:  Starts bilaterally in back of neck and travels up to the front.  Sometimes just right-sided Quality:  Throbbing, pressure Intensity:  9-10/10 Aura:  no Prodrome:  no Associated symptoms:  Head feels tingling, photophobia, phonophobia, phonosensitivity in right ear.  No nausea or visual disturbance Duration:  All day Frequency:  From 5/8 to 5/17, it was daily.  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  Smell of cleaning fluids and noise from residence trigger it. Relieving factors:  Keeping still Activity:  Functions but rather would lay down  Past abortive therapy:  Tylenol, meloxicam, diclofenac, Vicodin, naproxen Past preventative therapy:  none  Current abortive therapy:  Excedrin (takes 1 to 2 days a week at most.  Ineffective) Current preventative therapy:  none  CT of head from 04/15/15 was normal.  Caffeine:  Coffee daily Alcohol:  no Smoker:  Current smoker Diet:  Only eats dinner.  Drinks water Exercise:  Not routine Depression/stress:  Family stress Sleep hygiene:  poor Family history of headache:  Mother.  No family history of aneurysms.  She also reports numbness in the 4th and 5th digits of her right hand.  She says she often leans on her elbow.  PAST MEDICAL HISTORY: Past Medical History  Diagnosis Date  . Headache     PAST SURGICAL HISTORY: Past Surgical History  Procedure Laterality Date  . Tubal  ligation      MEDICATIONS: No current outpatient prescriptions on file prior to visit.   No current facility-administered medications on file prior to visit.    ALLERGIES: Allergies  Allergen Reactions  . Other Other (See Comments)    Narcotics pain medications don't work for patient   . Morphine And Related Rash    Breakouts     FAMILY HISTORY: No family history on file.  SOCIAL HISTORY: History   Social History  . Marital Status: Single    Spouse Name: N/A  . Number of Children: N/A  . Years of Education: N/A   Occupational History  . Not on file.   Social History Main Topics  . Smoking status: Current Every Day Smoker  . Smokeless tobacco: Not on file  . Alcohol Use: Yes     Comment: occasional  . Drug Use: No  . Sexual Activity: Not on file   Other Topics Concern  . Not on file   Social History Narrative    REVIEW OF SYSTEMS: Constitutional: No fevers, chills, or sweats, no generalized fatigue, change in appetite Eyes: No visual changes, double vision, eye pain Ear, nose and throat: No hearing loss, ear pain, nasal congestion, sore throat Cardiovascular: No chest pain, palpitations Respiratory:  No shortness of breath at rest or with exertion, wheezes GastrointestinaI: No nausea, vomiting, diarrhea, abdominal pain, fecal incontinence Genitourinary:  No dysuria, urinary retention or frequency Musculoskeletal:  No neck pain, back pain Integumentary: No rash, pruritus, skin lesions Neurological: as above Psychiatric: No depression, insomnia, anxiety Endocrine:  No palpitations, fatigue, diaphoresis, mood swings, change in appetite, change in weight, increased thirst Hematologic/Lymphatic:  No anemia, purpura, petechiae. Allergic/Immunologic: no itchy/runny eyes, nasal congestion, recent allergic reactions, rashes  PHYSICAL EXAM: Filed Vitals:   05/08/15 1028  BP: 120/74  Pulse: 87   General: No acute distress Head:  Normocephalic/atraumatic Eyes:   fundi unremarkable, without vessel changes, exudates, hemorrhages or papilledema. Neck: supple, no paraspinal tenderness, full range of motion Back: No paraspinal tenderness Heart: regular rate and rhythm Lungs: Clear to auscultation bilaterally. Vascular: No carotid bruits. Neurological Exam: Mental status: alert and oriented to person, place, and time, recent and remote memory intact, fund of knowledge intact, attention and concentration intact, speech fluent and not dysarthric, language intact. Cranial nerves: CN I: not tested CN II: pupils equal, round and reactive to light, visual fields intact, fundi unremarkable, without vessel changes, exudates, hemorrhages or papilledema. CN III, IV, VI:  full range of motion, no nystagmus, no ptosis CN V: facial sensation intact CN VII: upper and lower face symmetric CN VIII: hearing intact CN IX, X: gag intact, uvula midline CN XI: sternocleidomastoid and trapezius muscles intact CN XII: tongue midline Bulk & Tone: normal, no fasciculations. Motor:  5/5 throughout Sensation:  Temperature and vibration intact Deep Tendon Reflexes:  2+ throughout, toes downgoing Finger to nose testing:  No dysmetria Heel to shin:  No dysmetria Gait:  Normal station and stride.  Able to turn and walk in tandem. Romberg negative.  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 Smoking cessation Advised to not lean on elbow or to try elbow pad.  If symptoms worsen, we would need to get a [facility] Call in 4 weeks with update.  Follow up in 3 months.  Thank you for allowing me to take part in the care of this patient.  Shon Millet, DO  CC:  Fleet Contras, MD

## 2015-05-08 NOTE — Patient Instructions (Signed)
Migraine Recommendations: 1.  Start topiramate 50mg  tablet.  Take 1/2 tablet at bedtime x 7 days.  Then increase to 1 full tablet at bedtime.  Call in 4 weeks with update and we can adjust dose if needed. 2.  Take sumatriptan 100mg  at earliest onset of headache.  May repeat dose once in 2 hours if needed.  Do not exceed two tablets in 24 hours. 3.  Limit use of pain relievers to no more than 2 days out of the week.  These medications include acetaminophen, ibuprofen, triptans and narcotics.  This will help reduce risk of rebound headaches. 4.  Be aware of common food triggers such as processed sweets, processed foods with nitrites (such as deli meat, hot dogs, sausages), foods with MSG, alcohol (such as wine), chocolate, certain cheeses, certain fruits (dried fruits, some citrus fruit), vinegar, diet soda. 4.  Avoid caffeine 5.  Routine exercise 6.  Proper sleep hygiene 7.  Stay adequately hydrated with water 8.  Keep a headache diary. 9.  Maintain proper stress management. 10.  Stop smoking 11.  So not skip meals 12.  Follow up in 3 months.

## 2015-05-14 ENCOUNTER — Telehealth: Payer: Self-pay | Admitting: *Deleted

## 2015-05-14 NOTE — Telephone Encounter (Signed)
Patient was advised to continue topamax and call when 4 weeks is up  She needs to give it the full 4 weeks to see if it is going to work

## 2015-05-14 NOTE — Telephone Encounter (Signed)
Patient complaining of worse headaches with the imitrex she would like to know if there is anything else she can take Call back number [phone]

## 2015-06-04 ENCOUNTER — Other Ambulatory Visit: Payer: Self-pay | Admitting: *Deleted

## 2015-06-04 ENCOUNTER — Telehealth: Payer: Self-pay | Admitting: Neurology

## 2015-06-04 DIAGNOSIS — G43009 Migraine without aura, not intractable, without status migrainosus: Secondary | ICD-10-CM

## 2015-06-04 MED ORDER — RIZATRIPTAN BENZOATE 10 MG PO TBDP: 10.0000 mg | ORAL_TABLET | ORAL | Status: DC | PRN

## 2015-06-04 NOTE — Telephone Encounter (Signed)
Pt states that she was to call back with a 4 week update pt states that she is still having headaches and is taking the imitrex 50 mg. Heart racing when she takes it heart rate is 104 and no longer sleeping please call her at [phone]

## 2015-06-04 NOTE — Telephone Encounter (Signed)
Pt states that she was to call back with a 4 week update pt states that she is still having headaches and is taking the imitrex 50 mg. Heart racing when she takes it heart rate is 104 and no longer sleeping  I spoke with patient it is not quite 4 weeks  Since her first visit and starting the medications she states the Imitrex has her off balance and is just not helping Please advise .

## 2015-06-04 NOTE — Telephone Encounter (Signed)
Instead of sumatriptan, she can try Maxalt .  She should take at earliest onset of headache and may repeat once in 2 hours if needed (not to exceed 2 tablets in 24 hours).

## 2015-06-04 NOTE — Telephone Encounter (Signed)
Instead of sumatriptan, she can try Maxalt . She should take at earliest onset of headache and may repeat once in 2 hours if needed (not to exceed 2 tablets in 24 hours).  Patient  Is aware and Maxalt has been sent to pharmacy

## 2015-08-24 ENCOUNTER — Encounter: Payer: Self-pay | Admitting: Neurology

## 2015-08-24 ENCOUNTER — Ambulatory Visit (INDEPENDENT_AMBULATORY_CARE_PROVIDER_SITE_OTHER): Payer: Medicaid Other | Admitting: Neurology

## 2015-08-24 VITALS — BP 120/80 | HR 94 | Wt 156.0 lb

## 2015-08-24 DIAGNOSIS — G43709 Chronic migraine without aura, not intractable, without status migrainosus: Secondary | ICD-10-CM

## 2015-08-24 DIAGNOSIS — Z720 Tobacco use: Secondary | ICD-10-CM

## 2015-08-24 NOTE — Progress Notes (Addendum)
NEUROLOGY FOLLOW UP OFFICE NOTE  [name] [id number]  HISTORY OF PRESENT ILLNESS: Christy Leblanc is a 44 year old right-handed woman who is a current smoker who follows up for cervicogenic migraines.  UPDATE: Hand numbness has resolved. Headaches are unchanged but she has not been taking topiramate for the past month because she ran out. Intensity:  9-10/10 Duration:  Less than 2 hours with Maxalt (otherwise all day) Frequency:  15 headache days per month (10 days severe) Current abortive medication:  Maxalt  Antihypertensive medications:  hydroxyzine Antidepressant medications:  none Anticonvulsant medications:  Was taking topiramate  Vitamins/Herbal/Supplements:  none Other therapy:  none  HISTORY: Onset:  Since 44 years old following a head injury with mild concussion.  Cans in a grocery store fell on her head. Location:  Starts bilaterally in back of neck and travels up to the front.  Sometimes just right-sided Quality:  Throbbing, pressure Intensity:  9-10/10 Aura:  no Prodrome:  no Associated symptoms:  Head feels tingling, photophobia, phonophobia, phonosensitivity in right ear.  No nausea or visual disturbance Duration:  All day Frequency:  From 5/8 to 5/17, it was daily.  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  Smell of cleaning fluids and noise from residence trigger it. Relieving factors:  Keeping still Activity:  Functions but rather would lay down  Past abortive therapy:  sumatriptan , Tylenol, meloxicam, diclofenac, Vicodin, naproxen Past preventative therapy:  none  Current abortive therapy:  Excedrin (takes 1 to 2 days a week at most.  Ineffective) Current preventative therapy:  none  CT of head from 04/15/15 was normal.  Caffeine:  Coffee daily Alcohol:  no Smoker:  Current smoker Diet:  Only eats dinner.  Drinks water Exercise:  Not routine Depression/stress:  Family  stress Sleep hygiene:  poor Family history of headache:  Mother.  No family history of aneurysms.  PAST MEDICAL HISTORY: Past Medical History  Diagnosis Date  . Headache     MEDICATIONS: Current Outpatient Prescriptions on File Prior to Visit  Medication Sig Dispense Refill  . rizatriptan (MAXALT-MLT) 10 MG disintegrating tablet Take 1 tablet (10 mg total) by mouth as needed for migraine. May repeat in 2 hours if needed 9 tablet 11  . topiramate (TOPAMAX) 50 MG tablet Take 0.5tab at bedtime for 7 days, then 1 tab at bedtime 30 tablet 0   No current facility-administered medications on file prior to visit.    ALLERGIES: Allergies  Allergen Reactions  . Other Other (See Comments)    Narcotics pain medications don't work for patient   . Morphine And Related Rash    Breakouts     FAMILY HISTORY: No family history on file.  SOCIAL HISTORY: Social History   Social History  . Marital Status: Single    Spouse Name: N/A  . Number of Children: N/A  . Years of Education: N/A   Occupational History  . Not on file.   Social History Main Topics  . Smoking status: Current Every Day Smoker  . Smokeless tobacco: Not on file  . Alcohol Use: Yes     Comment: occasional  . Drug Use: No  . Sexual Activity: Not on file   Other Topics Concern  . Not on file   Social History Narrative    REVIEW OF SYSTEMS: Constitutional: No fevers, chills, or sweats, no generalized fatigue, change in appetite Eyes: No visual changes, double vision, eye pain Ear,  nose and throat: No hearing loss, ear pain, nasal congestion, sore throat Cardiovascular: No chest pain, palpitations Respiratory:  No shortness of breath at rest or with exertion, wheezes GastrointestinaI: No nausea, vomiting, diarrhea, abdominal pain, fecal incontinence Genitourinary:  No dysuria, urinary retention or frequency Musculoskeletal:  No neck pain, back pain Integumentary: No rash, pruritus, skin lesions Neurological:  as above Psychiatric: No depression, insomnia, anxiety Endocrine: No palpitations, fatigue, diaphoresis, mood swings, change in appetite, change in weight, increased thirst Hematologic/Lymphatic:  No anemia, purpura, petechiae. Allergic/Immunologic: no itchy/runny eyes, nasal congestion, recent allergic reactions, rashes  PHYSICAL EXAM: Filed Vitals:   08/24/15 1341  BP: 120/80  Pulse: 94   General: No acute distress.   Head:  Normocephalic/atraumatic Eyes:  Fundoscopic exam unremarkable without vessel changes, exudates, hemorrhages or papilledema. Neck: supple, no paraspinal tenderness, full range of motion Heart:  Regular rate and rhythm Lungs:  Clear to auscultation bilaterally Back: No paraspinal tenderness Neurological Exam: alert and oriented to person, place, and time. Attention span and concentration intact, recent and remote memory intact, fund of knowledge intact.  Speech fluent and not dysarthric, language intact.  CN II-XII intact. Fundoscopic exam unremarkable without vessel changes, exudates, hemorrhages or papilledema.  Bulk and tone normal, muscle strength 5/5 throughout.  Sensation to light touch intact.  Deep tendon reflexes 2+ throughout.  Finger to nose and heel to shin testing intact.  Gait normal.  IMPRESSION: Cervicogenic migraine, chronic   PLAN: 1.  Restart topiramate and titrate to  at bedtime 2.  Maxalt  for abortive therapy 3.  She will call in 4 weeks with update 4.  Smoking cessation 5.  Follow up in 3 months.  15 minutes spent face to face with patient, over 50% spent discussing management.  Shon Millet, DO  CC:  Fleet Contras, MD

## 2015-08-24 NOTE — Patient Instructions (Signed)
Migraine Recommendations: 1.  Start topiramate  tablets.  Take 1 tablet at bedtime for 7 days, then 2 tablets at bedtime.  Call in 4 weeks with update and we can adjust dose if needed. 2.  Take rizatriptan  at earliest onset of headache.  May repeat dose once in 2 hours if needed.  Do not exceed two tablets in 24 hours. 3.  Limit use of pain relievers to no more than 2 days out of the week.  These medications include acetaminophen, ibuprofen, triptans and narcotics.  This will help reduce risk of rebound headaches. 4.  Be aware of common food triggers such as processed sweets, processed foods with nitrites (such as deli meat, hot dogs, sausages), foods with MSG, alcohol (such as wine), chocolate, certain cheeses, certain fruits (dried fruits, some citrus fruit), vinegar, diet soda. 4.  Avoid caffeine 5.  Routine exercise 6.  Proper sleep hygiene 7.  Stay adequately hydrated with water 8.  Keep a headache diary. 9.  Maintain proper stress management. 10.  Do not skip meals. 11.  Consider supplements:  Magnesium oxide  to  daily, riboflavin , Coenzyme Q 10  three times daily 12.  Try to quit smoking 13.  Follow up in 3 months.  CALL IN 4 WEEKS WITH UPDATE.

## 2015-08-26 ENCOUNTER — Telehealth: Payer: Self-pay | Admitting: Neurology

## 2015-08-26 MED ORDER — TOPIRAMATE 50 MG PO TABS: ORAL_TABLET | ORAL | Status: DC

## 2015-08-26 NOTE — Telephone Encounter (Signed)
Okay to prescribe topiramate  tablets and to titrate as follows: Take 1 tablet at bedtime for 7 days, then 2 tablets at bedtime.  #60, no refills

## 2015-08-26 NOTE — Telephone Encounter (Signed)
Med sent in, tried to contact patient no answer

## 2015-08-26 NOTE — Telephone Encounter (Signed)
Pt states that she needs her tompax called in to [facility] pt phone number [phone]

## 2015-08-26 NOTE — Telephone Encounter (Signed)
Patient wanting refill

## 2015-11-13 ENCOUNTER — Ambulatory Visit: Payer: Medicaid Other | Attending: Podiatry | Admitting: Physical Therapy

## 2015-11-13 DIAGNOSIS — M2011 Hallux valgus (acquired), right foot: Secondary | ICD-10-CM | POA: Insufficient documentation

## 2015-11-13 DIAGNOSIS — M659 Synovitis and tenosynovitis, unspecified: Secondary | ICD-10-CM | POA: Insufficient documentation

## 2015-11-13 DIAGNOSIS — Z9889 Other specified postprocedural states: Secondary | ICD-10-CM | POA: Insufficient documentation

## 2015-11-13 DIAGNOSIS — M25671 Stiffness of right ankle, not elsewhere classified: Secondary | ICD-10-CM | POA: Insufficient documentation

## 2015-11-13 DIAGNOSIS — M25674 Stiffness of right foot, not elsewhere classified: Secondary | ICD-10-CM
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
## 2015-12-11 ENCOUNTER — Ambulatory Visit: Payer: Medicaid Other | Admitting: Neurology

## 2016-08-18 DIAGNOSIS — Z1231 Encounter for screening mammogram for malignant neoplasm of breast: Secondary | ICD-10-CM | POA: Diagnosis not present

## 2016-08-18 DIAGNOSIS — Z1389 Encounter for screening for other disorder: Secondary | ICD-10-CM | POA: Diagnosis not present

## 2016-08-18 DIAGNOSIS — Z01419 Encounter for gynecological examination (general) (routine) without abnormal findings: Secondary | ICD-10-CM | POA: Diagnosis not present

## 2016-08-18 DIAGNOSIS — Z130 Encounter for screening for diseases of the blood and blood-forming organs and certain disorders involving the immune mechanism: Secondary | ICD-10-CM | POA: Diagnosis not present

## 2016-08-18 DIAGNOSIS — Z6825 Body mass index (BMI) 25.0-25.9, adult: Secondary | ICD-10-CM | POA: Diagnosis not present

## 2016-10-24 ENCOUNTER — Emergency Department (HOSPITAL_COMMUNITY)
Admission: EM | Admit: 2016-10-24 | Discharge: 2016-10-24 | Disposition: A | Discharge status: Home / Self Care | Payer: Medicaid Other | Attending: Emergency Medicine | Admitting: Emergency Medicine

## 2016-10-24 ENCOUNTER — Emergency Department (HOSPITAL_COMMUNITY): Payer: 59

## 2016-10-24 ENCOUNTER — Encounter (HOSPITAL_COMMUNITY): Payer: Self-pay

## 2016-10-24 ENCOUNTER — Encounter (HOSPITAL_COMMUNITY): Payer: Self-pay | Admitting: *Deleted

## 2016-10-24 ENCOUNTER — Emergency Department (HOSPITAL_COMMUNITY): Payer: Medicaid Other

## 2016-10-24 ENCOUNTER — Emergency Department (HOSPITAL_COMMUNITY)
Admission: EM | Admit: 2016-10-24 | Discharge: 2016-10-24 | Disposition: A | Discharge status: Home / Self Care | Payer: 59 | Attending: Emergency Medicine | Admitting: Emergency Medicine

## 2016-10-24 DIAGNOSIS — Z79899 Other long term (current) drug therapy: Secondary | ICD-10-CM | POA: Insufficient documentation

## 2016-10-24 DIAGNOSIS — S6991XA Unspecified injury of right wrist, hand and finger(s), initial encounter: Secondary | ICD-10-CM | POA: Diagnosis not present

## 2016-10-24 DIAGNOSIS — W19XXXA Unspecified fall, initial encounter: Secondary | ICD-10-CM

## 2016-10-24 DIAGNOSIS — Y9389 Activity, other specified: Secondary | ICD-10-CM | POA: Diagnosis not present

## 2016-10-24 DIAGNOSIS — Y999 Unspecified external cause status: Secondary | ICD-10-CM | POA: Insufficient documentation

## 2016-10-24 DIAGNOSIS — M545 Low back pain: Secondary | ICD-10-CM | POA: Insufficient documentation

## 2016-10-24 DIAGNOSIS — F17200 Nicotine dependence, unspecified, uncomplicated: Secondary | ICD-10-CM | POA: Insufficient documentation

## 2016-10-24 DIAGNOSIS — M79605 Pain in left leg: Secondary | ICD-10-CM | POA: Diagnosis not present

## 2016-10-24 DIAGNOSIS — M5440 Lumbago with sciatica, unspecified side: Secondary | ICD-10-CM | POA: Diagnosis not present

## 2016-10-24 DIAGNOSIS — M25551 Pain in right hip: Secondary | ICD-10-CM | POA: Diagnosis not present

## 2016-10-24 DIAGNOSIS — W108XXA Fall (on) (from) other stairs and steps, initial encounter: Secondary | ICD-10-CM | POA: Insufficient documentation

## 2016-10-24 DIAGNOSIS — R52 Pain, unspecified: Secondary | ICD-10-CM

## 2016-10-24 DIAGNOSIS — M25561 Pain in right knee: Secondary | ICD-10-CM | POA: Diagnosis not present

## 2016-10-24 DIAGNOSIS — X509XXA Other and unspecified overexertion or strenuous movements or postures, initial encounter: Secondary | ICD-10-CM | POA: Insufficient documentation

## 2016-10-24 DIAGNOSIS — S79911A Unspecified injury of right hip, initial encounter: Secondary | ICD-10-CM | POA: Diagnosis not present

## 2016-10-24 DIAGNOSIS — M79644 Pain in right finger(s): Secondary | ICD-10-CM | POA: Insufficient documentation

## 2016-10-24 DIAGNOSIS — Y939 Activity, unspecified: Secondary | ICD-10-CM | POA: Insufficient documentation

## 2016-10-24 DIAGNOSIS — Y929 Unspecified place or not applicable: Secondary | ICD-10-CM | POA: Insufficient documentation

## 2016-10-24 DIAGNOSIS — Z5321 Procedure and treatment not carried out due to patient leaving prior to being seen by health care provider: Secondary | ICD-10-CM | POA: Insufficient documentation

## 2016-10-24 DIAGNOSIS — S99911A Unspecified injury of right ankle, initial encounter: Secondary | ICD-10-CM | POA: Diagnosis not present

## 2016-10-24 DIAGNOSIS — M79661 Pain in right lower leg: Secondary | ICD-10-CM | POA: Insufficient documentation

## 2016-10-24 DIAGNOSIS — S8991XA Unspecified injury of right lower leg, initial encounter: Secondary | ICD-10-CM | POA: Diagnosis not present

## 2016-10-24 DIAGNOSIS — M25531 Pain in right wrist: Secondary | ICD-10-CM | POA: Diagnosis not present

## 2016-10-24 DIAGNOSIS — M25571 Pain in right ankle and joints of right foot: Secondary | ICD-10-CM | POA: Diagnosis not present

## 2016-10-24 DIAGNOSIS — M79641 Pain in right hand: Secondary | ICD-10-CM | POA: Diagnosis not present

## 2016-10-24 LAB — I-STAT BETA HCG BLOOD, ED (MC, WL, AP ONLY): I-stat hCG, quantitative: <5 m[IU]/mL (ref <5)

## 2016-10-24 MED ORDER — NAPROXEN 500 MG PO TABS: 500.0000 mg | ORAL_TABLET | Freq: Two times a day (BID) | ORAL | 0 refills | Status: DC

## 2016-10-24 MED ORDER — KETOROLAC TROMETHAMINE 30 MG/ML IJ SOLN
60.0000 mg | Freq: Once | INTRAMUSCULAR | Status: AC
  Administered 2016-10-24: 60 mg via INTRAMUSCULAR
  Filled 2016-10-24: qty 2

## 2016-10-24 MED ORDER — CYCLOBENZAPRINE HCL 5 MG PO TABS: 5.0000 mg | ORAL_TABLET | Freq: Three times a day (TID) | ORAL | 0 refills | Status: DC | PRN

## 2016-10-24 NOTE — ED Notes (Signed)
Patient transported to X-ray 

## 2016-10-24 NOTE — Discharge Instructions (Signed)
Read the information below.  Your x-ray shows a possible mild subluxation of your right ankle. You are being placed in an ankle brace and given crutches. I have prescribed naprosyn for pain relief. While taking do not take other NSAIDs (ibuprofen, motrin, aleve). I have also prescribed flexeril. This can make you drowsy, do not drive after taking.  I have provided the contact information for orthopedics - please call to schedule a follow up appointment regarding your ankle.  Use the prescribed medication as directed.  Please discuss all new medications with your pharmacist.  Follow up with your primary provider if symptoms persist.  You may return to the Emergency Department at any time for worsening condition or any new symptoms that concern you. Return if you develop fever, loss of bowel or bladder function, numbness, or any other new/concerning symptoms.

## 2016-10-24 NOTE — ED Provider Notes (Signed)
[facility]-EMERGENCY DEPT Provider Note   CSN: [id number] Arrival date & time: 10/24/16  1726  By signing my name below, I, Christy Leblanc, attest that this documentation has been prepared under the direction and in the presence of Christy MeresAshley Meyer, PA-C.  Electronically Signed: Rosario AdieWilliam Andrew Leblanc, ED Scribe. 10/24/16. 6:45 PM.  History   Chief Complaint Chief Complaint  Patient presents with  . Back Pain  . Leg Pain   The history is provided by the patient and medical records. No language interpreter was used.    HPI Comments: Christy Leblanc is a 45 y.o. female with no pertinent PMHx, who presents to the Emergency Department complaining of sudden onset, constant centralized lower back pain onset approximately 3 days ago. No radiation of pain. Pt reports that approximately 9 days ago that she sustained a mechanical, ground-level fall onto carpet with only minor bruising to her bilateral lower extermities and pain to her right wrist/hand, right ankle, and right knee from this event. No LOC or head injury. She has since had diffuse, moderate body aches from this fall. However, approximately three days ago she states that she was moving a washing machine up a ramp when she felt a sudden "pinch" in her lower back, and her back pain has been constant since. Pt took a muscle relaxer yesterday and two Advil today at home with minimal relief of her pain. Her pain is exacerbated with generalized movements. [facility] where she had DG right wrist and hand performed that was unremarkable. Pt eloped at that time without further interventions. No h/o cancer or IVDU. No PSHx to the back. Pt is not currently on anticoagulant or antiplatelet therapy. She denies numbness, weakness, bowel/bladder incontinence, fever, or any other associated symptoms.   Past Medical History:  Diagnosis Date  . Headache    Patient Active Problem List   Diagnosis Date Noted  .  Chronic migraine without aura without status migrainosus, not intractable 05/08/2015  . Tobacco abuse 05/08/2015   Past Surgical History:  Procedure Laterality Date  . TUBAL LIGATION     OB History    No data available     Home Medications    Prior to Admission medications   Medication Sig Start Date End Date Taking? Authorizing Provider  clobetasol ointment (TEMOVATE) 0.05 %  08/12/15   Historical Provider, MD  cyclobenzaprine (FLEXERIL) 5 MG tablet Take 1 tablet (5 mg total) by mouth 3 (three) times daily as needed for muscle spasms. 10/24/16   Christy KettleAshley Laurel Meyer, PA-C  hydrOXYzine (ATARAX/VISTARIL) 25 MG tablet take 1 tablet by mouth every 8 hours if needed 08/05/15   Historical Provider, MD  meloxicam (MOBIC) 7.5 MG tablet take 1 tablet by mouth twice a day with food for pain 06/24/15   Historical Provider, MD  naproxen (NAPROSYN) 500 MG tablet Take 1 tablet (500 mg total) by mouth 2 (two) times daily. 10/24/16   Christy KettleAshley Laurel Meyer, PA-C  NEO-SYNALAR 0.35-0.025 % CREA  07/24/15   Historical Provider, MD  omeprazole (PRILOSEC) 20 MG capsule Take 20 mg by mouth daily. 08/05/15   Historical Provider, MD  rizatriptan (MAXALT-MLT) 10 MG disintegrating tablet Take 1 tablet (10 mg total) by mouth as needed for migraine. May repeat in 2 hours if needed 06/04/15   Christy DallasAdam R Jaffe, DO  SUMAtriptan Succinate (IMITREX PO) Take by mouth.    Historical Provider, MD  terbinafine (LAMISIL) 250 MG tablet  08/12/15  Historical Provider, MD  topiramate (TOPAMAX) 50 MG tablet Take one tablet at bedtime for 7 days then 2 tablets at bedtime 08/26/15   Christy DallasAdam R Jaffe, DO   Family History No family history on file.  Social History Social History  Substance Use Topics  . Smoking status: Current Some Day Smoker  . Smokeless tobacco: Never Used  . Alcohol use Yes     Comment: occasional    Allergies   Other and Morphine and related  Review of Systems Review of Systems  Constitutional: Negative for fever.    Musculoskeletal: Positive for arthralgias (right wrist/hand, right ankle, right knee), back pain (lower) and myalgias.  Skin: Positive for color change (brusing, bilateral lower extremities).  Neurological: Negative for syncope, weakness, numbness and headaches.       Negative for bowel/bladder incontinence.  All other systems reviewed and are negative.  Physical Exam Updated Vital Signs BP 117/86 (BP Location: Left Arm)   Pulse 79   Temp 98.1 F (36.7 C) (Oral)   Resp 22   LMP 10/03/2016   SpO2 99%   Physical Exam  Constitutional: She appears well-developed and well-nourished. No distress.  HENT:  Head: Normocephalic and atraumatic. Head is without raccoon's eyes and without Battle's sign.  Right Ear: External ear normal.  Left Ear: External ear normal.  Nose: Nose normal.  Mouth/Throat: Uvula is midline and oropharynx is clear and moist. No trismus in the jaw. No oropharyngeal exudate.  Eyes: Conjunctivae and EOM are normal. Pupils are equal, round, and reactive to light. No scleral icterus.  Neck: Normal range of motion. No neck rigidity. Normal range of motion present.  No cervical spinal tenderness. Neck ROM intact.   Pulmonary/Chest: Effort normal. No respiratory distress.  Abdominal: She exhibits no distension.  Musculoskeletal: She exhibits tenderness. She exhibits no edema or deformity.       Right hip: She exhibits tenderness.       Right knee: She exhibits no swelling, no effusion, no ecchymosis, no deformity and no erythema. Tenderness found.       Right ankle: She exhibits no swelling, no ecchymosis and no deformity. Tenderness.       Lumbar back: She exhibits tenderness. She exhibits no edema, no deformity and no laceration.  TTP of lumbar spine, right lumbar paravertebral muscles, and SI joint. No crepitus. No step off or obvious deformity. TTP of right hip, right knee, and right ankle. No appreciable deformity, warmth, or erythema. Healing bruises noted to right  extremity. ROM, strength, sensation intact. Distal pulses are palpable.   Neurological: She is alert. She is not disoriented. She displays normal reflexes. No cranial nerve deficit or sensory deficit. She exhibits normal muscle tone. Coordination and gait normal. GCS eye subscore is 4. GCS verbal subscore is 5. GCS motor subscore is 6.  CN 2-12 grossly intact. Pt is ambulatory w/o difficulty. Normal strength and sensation to bilateral lower extremities.   Skin: Skin is warm and dry. She is not diaphoretic.  Psychiatric: She has a normal mood and affect. Her behavior is normal.   ED Treatments / Results  DIAGNOSTIC STUDIES: Oxygen Saturation is 100% on RA, normal by my interpretation.   COORDINATION OF CARE: 6:45 PM-Discussed next steps with pt. Pt verbalized understanding and is agreeable with the plan.   Radiology Dg Lumbar Spine Complete  Result Date: 10/24/2016 CLINICAL DATA:  Fall down stairs 1 week ago with low back pain getting worse. EXAM: LUMBAR SPINE - COMPLETE 4+ VIEW COMPARISON:  01/26/2009  FINDINGS: Vertebral body alignment, heights and disc space heights are within normal. There is mild spondylosis present. There is no acute fracture or subluxation. Possible right nephrolithiasis. IMPRESSION: Minimal spondylosis of the lumbar spine.  No acute findings. Possible right nephrolithiasis. Electronically Signed   By: Elberta Fortis M.D.   On: 10/24/2016 20:26   Dg Wrist Complete Right  Result Date: 10/24/2016 CLINICAL DATA:  Fall several days ago with right hand and wrist pain mainly at the proximal thumb. Initial encounter. EXAM: RIGHT WRIST - COMPLETE 3+ VIEW COMPARISON:  None. FINDINGS: There is no evidence of fracture or dislocation. There is no evidence of arthropathy or other focal bone abnormality. Soft tissues are unremarkable. IMPRESSION: Negative. Electronically Signed   By: Marnee Spring M.D.   On: 10/24/2016 13:26   Dg Ankle Complete Right  Result Date:  10/24/2016 CLINICAL DATA:  Fall down stairs 1 week ago with right ankle pain. EXAM: RIGHT ANKLE - COMPLETE 3+ VIEW COMPARISON:  02/26/2014 FINDINGS: Examination demonstrates no acute fracture. Cannot exclude mild anterior subluxation of the tibia on the talus. Remainder of the exam is unremarkable. IMPRESSION: No acute fracture. Possible mild anterior subluxation of the tibia on the talus. Electronically Signed   By: Elberta Fortis M.D.   On: 10/24/2016 20:31   Dg Knee Complete 4 Views Right  Result Date: 10/24/2016 CLINICAL DATA:  Fall down stairs 1 week ago with right leg pain. EXAM: RIGHT KNEE - COMPLETE 4+ VIEW COMPARISON:  None. FINDINGS: No evidence of fracture, dislocation, or joint effusion. No evidence of arthropathy or other focal bone abnormality. Soft tissues are unremarkable. IMPRESSION: Negative. Electronically Signed   By: Elberta Fortis M.D.   On: 10/24/2016 20:27   Dg Hand Complete Right  Result Date: 10/24/2016 CLINICAL DATA:  Fall several days ago with right hand and wrist pain mainly at the proximal thumb. Initial encounter. EXAM: RIGHT HAND - COMPLETE 3+ VIEW COMPARISON:  None. FINDINGS: There is no evidence of fracture or dislocation. There is no evidence of arthropathy or other focal bone abnormality. Soft tissues are unremarkable. IMPRESSION: Negative. Electronically Signed   By: Marnee Spring M.D.   On: 10/24/2016 13:26   Dg Hip Unilat With Pelvis 2-3 Views Right  Result Date: 10/24/2016 CLINICAL DATA:  Fall down stairs 1 week ago with right hip pain. EXAM: DG HIP (WITH OR WITHOUT PELVIS) 2-3V RIGHT COMPARISON:  None. FINDINGS: No evidence of fracture or dislocation involving the right hip. 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 IMPRESSION: No acute findings. Electronically Signed   By: Elberta Fortis M.D.   On: 10/24/2016 20:28   Procedures Procedures   Medications Ordered in ED Medications  ketorolac  (TORADOL) 30 MG/ML injection 60 mg (60 mg Intramuscular Given 10/24/16 2133)    Initial Impression / Assessment and Plan / ED Course  I have reviewed the triage vital signs and the nursing notes.  Pertinent labs & imaging results that were available during my care of the patient were reviewed by me and considered in my medical decision making (see chart for details).  Clinical Course as of Oct 27 1111  Mon Oct 24, 2016  2100 No obvious fracture.  DG Ankle Complete Right [AM]  2100 No obvious fracture or dislocation. DG Hip Unilat With Pelvis 2-3 Views Right [AM]  2100 NO obvious fracture or dislocation.  DG Knee Complete 4 Views Right [AM]  2100 No obvious fracture or subluxation.  DG  Lumbar Spine Complete [AM]  Wed Oct 26, 2016  1110 No DG Hip Unilat With Pelvis 2-3 Views Right [AM]    Clinical Course User Index [AM] Christy Kettle, PA-C   Patient is a 45yo female with no pertinent hx who presents to the ED with lower back pain s/p mechanical injury. No head trauma or LOC. No anti-coagulation therapy. No neurological deficits appreciated. Patient is ambulatory. No warning symptoms of back pain including: fecal incontinence, urinary retention or overflow incontinence, unexplained fevers, h/o cancer, IVDU. Low suspicion for cauda equina, epidural abscess, or other serious cause of back pain. X-ray lumbar spine shows no acute abnormalities.   Pt had TTP of right hip, knee, and ankle without obvious deformity, warmth, or erythema. ROM, strength, sensation intact. Pt ambulatory. X-ray right hip and knee show no acute abnormality. X-ray of right ankle suggestive of ?possible mild anterior subluxation of tibia on talus. Pt placed in ASO and provided crutches encouraged to follow up with orthopedics, contact information provided.   Patient initially seen at [facility] and x-ray of right hand and wrist completed. Patient eloped prior to evaluation. X-ray of wrist and hand show no acute  abnormalities.   Conservative measures such as rest, ice/heat and pain medicine indicated with PCP follow-up if no improvement with conservative management. Rx naprosyn and flexeril. Return precautions given. Pt voiced understanding and is agreeable.   Final Clinical Impressions(s) / ED Diagnoses   Final diagnoses:  Acute bilateral low back pain, with sciatica presence unspecified  Pain of left lower extremity  Fall, initial encounter   New Prescriptions Discharge Medication List as of 10/24/2016  9:31 PM    START taking these medications   Details  cyclobenzaprine (FLEXERIL) 5 MG tablet Take 1 tablet (5 mg total) by mouth 3 (three) times daily as needed for muscle spasms., Starting Mon 10/24/2016, Print    naproxen (NAPROSYN) 500 MG tablet Take 1 tablet (500 mg total) by mouth 2 (two) times daily., Starting Mon 10/24/2016, Print       I personally performed the services described in this documentation, which was scribed in my presence. The recorded information has been reviewed and is accurate.     Christy Kettle, [place] 10/26/16 1112    Raeford Razor, MD 11/01/16 [phone]

## 2016-10-24 NOTE — ED Triage Notes (Addendum)
Pt complains of lower back pain, right thumb/wrist pain, and right lower leg pain since falling  down 13 steps last week. Pt has hx of bulging disc in lower back.  Pt states she is concerned she has DVT

## 2016-10-24 NOTE — Progress Notes (Signed)
Orthopedic Tech Progress Note Patient Details:  Sharlotte AlamoGloria Bouska 01-Apr-1971 [id number]  Ortho Devices Type of Ortho Device: ASO, Crutches Ortho Device/Splint Location: rle Ortho Device/Splint Interventions: Ordered, Application   Trinna PostMartinez, Atiya Yera J 10/24/2016, 9:46 PM

## 2016-10-24 NOTE — ED Triage Notes (Signed)
Pt reports she was moving a washing machine and felt something in her back pop. She also reports a fall last week. Pt reports some right leg pain as well. Pt ambulatory without difficulty.

## 2016-10-31 DIAGNOSIS — S9001XA Contusion of right ankle, initial encounter: Secondary | ICD-10-CM | POA: Diagnosis not present

## 2016-11-24 ENCOUNTER — Telehealth: Payer: Self-pay | Admitting: Neurology

## 2016-11-24 NOTE — Telephone Encounter (Signed)
Patient made appt to come abck to see dr Everlena Cooperjaffe on 12-27-16 but states she is having really bad headaches and the medication is not working any longer please call [phone]

## 2016-11-24 NOTE — Telephone Encounter (Signed)
Spoke with patient. She wanted to know if Dr. Everlena CooperJaffe would start her on a new medication prior to her appt. Patient made aware we will not be giving her new medication because we have not seen her in over a year (September 2016). She will keep her follow up appt with Dr. Everlena CooperJaffe to discuss headaches.

## 2016-11-24 NOTE — Telephone Encounter (Signed)
Left message on machine for patient to call back.   Patient was given an RX for Topamax #60 in 07/2015. Dr. Everlena CooperJaffe has not prescribed medication since that time. Patient has not follow up in our office since that time. She will need to be seen.

## 2016-12-27 ENCOUNTER — Other Ambulatory Visit (INDEPENDENT_AMBULATORY_CARE_PROVIDER_SITE_OTHER): Payer: 59

## 2016-12-27 ENCOUNTER — Ambulatory Visit (INDEPENDENT_AMBULATORY_CARE_PROVIDER_SITE_OTHER): Payer: 59 | Admitting: Neurology

## 2016-12-27 ENCOUNTER — Encounter: Payer: Self-pay | Admitting: Neurology

## 2016-12-27 VITALS — BP 126/84 | HR 91 | Ht 63.0 in | Wt 145.7 lb

## 2016-12-27 DIAGNOSIS — G43009 Migraine without aura, not intractable, without status migrainosus: Secondary | ICD-10-CM

## 2016-12-27 DIAGNOSIS — G43709 Chronic migraine without aura, not intractable, without status migrainosus: Secondary | ICD-10-CM

## 2016-12-27 DIAGNOSIS — Z720 Tobacco use: Secondary | ICD-10-CM

## 2016-12-27 DIAGNOSIS — Z79899 Other long term (current) drug therapy: Secondary | ICD-10-CM

## 2016-12-27 LAB — BASIC METABOLIC PANEL
BUN: 8 mg/dL (ref 6–23)
CHLORIDE: 105 meq/L (ref 96–112)
CO2: 29 mEq/L (ref 19–32)
Calcium: 9 mg/dL (ref 8.4–10.5)
Creatinine, Ser: 0.72 mg/dL (ref 0.40–1.20)
GFR: 113.17 mL/min (ref >60.00)
GLUCOSE: 84 mg/dL (ref 70–99)
POTASSIUM: 3.4 meq/L — AB (ref 3.5–5.1)
Sodium: 136 mEq/L (ref 135–145)

## 2016-12-27 MED ORDER — RIZATRIPTAN BENZOATE 10 MG PO TBDP: 10.0000 mg | ORAL_TABLET | ORAL | 11 refills | Status: DC | PRN

## 2016-12-27 MED ORDER — TOPIRAMATE 50 MG PO TABS: ORAL_TABLET | ORAL | 0 refills | Status: DC

## 2016-12-27 NOTE — Progress Notes (Signed)
NEUROLOGY FOLLOW UP OFFICE NOTE  Lennie Dunnigan [id number]  HISTORY OF PRESENT ILLNESS: Christy Leblanc is a 46 year old right-handed woman who is a current smoker who follows up for cervicogenic migraines.  UPDATE: I have not seen the patient since September 2016.  She was doing well until October, when she ran out of topamax and Maxalt.  Since then, she has had increased frequency and duration of headaches. Intensity:  10/10 Duration:  all day Frequency:  4 days a week Current abortive medication:  no Antihypertensive medications:  no Antidepressant medications:  none Anticonvulsant medications:  no Vitamins/Herbal/Supplements:  none Other therapy:  none  HISTORY: Onset:  Since 46 years old following a head injury with mild concussion.  Cans in a grocery store fell on her head. Location:  Starts bilaterally in back of neck and travels up to the front.  Sometimes just right-sided or left-sided Quality:  Throbbing, pressure Initial intensity:  9-10/10; September 2016: 9-10/10 Aura:  no Prodrome:  no Associated symptoms:  Head feels tingling, photophobia, phonophobia, phonosensitivity in right ear.  Blurred vision in either eye.  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  Smell of cleaning fluids and noise from residence trigger it. Relieving factors:  Keeping still Activity:  Functions but rather would lay down  Past abortive therapy:  sumatriptan 50mg , Tylenol, meloxicam, diclofenac, Vicodin, naproxen, Maxalt MLT 10mg  (effective), Excedrin Past preventative therapy:  topiramate 100mg  (effective)  CT of head from 04/15/15 was normal.  Caffeine:  Coffee daily Alcohol:  no Smoker:  Current smoker Diet:  Only eats dinner.  Drinks water Exercise:  Not  routine Depression/stress:  Family stress Sleep hygiene:  poor Family history of headache:  Mother.  No family history of aneurysms.  PAST MEDICAL HISTORY: Past Medical History:  Diagnosis Date  . Headache     MEDICATIONS: Current Outpatient Prescriptions on File Prior to Visit  Medication Sig Dispense Refill  . omeprazole (PRILOSEC) 20 MG capsule Take 20 mg by mouth daily.  0  . SUMAtriptan Succinate (IMITREX PO) Take by mouth.    . terbinafine (LAMISIL) 250 MG tablet   0   No current facility-administered medications on file prior to visit.     ALLERGIES: Allergies  Allergen Reactions  . Other Other (See Comments)    Narcotics pain medications don't work for patient   . Morphine And Related Rash    Breakouts     FAMILY HISTORY: History reviewed. No pertinent family history.  SOCIAL HISTORY: Social History   Social History  . Marital status: Single    Spouse name: N/A  . Number of children: N/A  . Years of education: N/A   Occupational History  . Not on file.   Social History Main Topics  . Smoking status: Current Some Day Smoker  . Smokeless tobacco: Never Used  . Alcohol use Yes     Comment: occasional  . Drug use: No  . Sexual activity: Not on file   Other Topics Concern  . Not on file   Social History Narrative  . No narrative on file    REVIEW OF SYSTEMS: Constitutional: No fevers, chills, or sweats, no generalized fatigue, change in appetite Eyes: No visual changes, double vision, eye pain Ear, nose and throat: No hearing loss, ear  pain, nasal congestion, sore throat Cardiovascular: No chest pain, palpitations Respiratory:  No shortness of breath at rest or with exertion, wheezes GastrointestinaI: No nausea, vomiting, diarrhea, abdominal pain, fecal incontinence Genitourinary:  No dysuria, urinary retention or frequency Musculoskeletal:  No neck pain, back pain Integumentary: No rash, pruritus, skin lesions Neurological: as  above Psychiatric: No depression, insomnia, anxiety Endocrine: No palpitations, fatigue, diaphoresis, mood swings, change in appetite, change in weight, increased thirst Hematologic/Lymphatic:  No purpura, petechiae. Allergic/Immunologic: no itchy/runny eyes, nasal congestion, recent allergic reactions, rashes  PHYSICAL EXAM: Vitals:   12/27/16 1352  BP: 126/84  Pulse: 91   General: No acute distress.  Patient appears well-groomed.  normal body habitus. Head:  Normocephalic/atraumatic Eyes:  Fundi examined but not visualized Neck: supple, no paraspinal tenderness, full range of motion Heart:  Regular rate and rhythm Lungs:  Clear to auscultation bilaterally Back: No paraspinal tenderness Neurological Exam: alert and oriented to person, place, and time. Attention span and concentration intact, recent and remote memory intact, fund of knowledge intact.  Speech fluent and not dysarthric, language intact.  CN II-XII intact. Bulk and tone normal, muscle strength 5/5 throughout.  Sensation to light touch  intact.  Deep tendon reflexes 2+ throughout.  Finger to nose testing intact.  Gait normal  IMPRESSION: Chronic migraine, exacerbated due to running out of her topamax Tobacco use  PLAN: 1.  Restart topiramate 50mg  at bedtime for 1 week, then increase to 100mg  at bedtime.  Check BMP. 2.  Refill Maxalt-MLT 10mg  for abortive therapy 3.  Limit use of pain relievers to no more than 2 days out of the week to prevent rebound headache. 4.  Be aware of common food triggers such as processed sweets, processed foods with nitrites (such as deli meat, hot dogs, sausages), foods with MSG, alcohol (such as wine), chocolate, certain cheeses, certain fruits (dried fruits, some citrus fruit), vinegar, diet soda. 4.  Avoid caffeine 5.  Routine exercise 6.  Proper sleep hygiene 7.  Stay adequately hydrated with water 8.  Keep a headache diary. 9.  Maintain proper stress management. 10.  Do not skip  meals. 11.  Consider supplements:  Magnesium citrate 400mg  to 600mg  daily, riboflavin 400mg , Coenzyme Q 10 100mg  three times daily 12.  Try to quit smoking 13.  Follow up in 3 months.  Shon MilletAdam Jaffe, DO  CC:  Fleet ContrasEdwin Avbuere, MD

## 2016-12-27 NOTE — Patient Instructions (Signed)
Migraine Recommendations: 1.  We will restart topiramate 50mg .     Take 1 tablet at bedtime for 7 days   Then 2 tablets at bedtime 2.  Take Maxalt-MLT 10mg  at earliest onset of headache.  May repeat dose once after 2 hours if needed.  Do not exceed two tablets in 24 hours. 3.  Limit use of pain relievers to no more than 2 days out of the week.  These medications include acetaminophen, ibuprofen, triptans and narcotics.  This will help reduce risk of rebound headaches. 4.  Be aware of common food triggers such as processed sweets, processed foods with nitrites (such as deli meat, hot dogs, sausages), foods with MSG, alcohol (such as wine), chocolate, certain cheeses, certain fruits (dried fruits, some citrus fruit), vinegar, diet soda. 4.  Avoid caffeine 5.  Routine exercise 6.  Proper sleep hygiene 7.  Stay adequately hydrated with water 8.  Keep a headache diary. 9.  Maintain proper stress management. 10.  Do not skip meals. 11.  Consider supplements:  Magnesium citrate 400mg  to 600mg  daily, riboflavin 400mg , Coenzyme Q 10 100mg  three times daily 12.  Try to quit smoking 13.  Follow up in 3 months.

## 2017-03-30 ENCOUNTER — Ambulatory Visit: Payer: 59 | Admitting: Neurology

## 2017-05-10 DIAGNOSIS — R350 Frequency of micturition: Secondary | ICD-10-CM | POA: Diagnosis not present

## 2017-05-10 DIAGNOSIS — N898 Other specified noninflammatory disorders of vagina: Secondary | ICD-10-CM | POA: Diagnosis not present

## 2017-05-10 DIAGNOSIS — A5901 Trichomonal vulvovaginitis: Secondary | ICD-10-CM | POA: Diagnosis not present

## 2017-05-10 DIAGNOSIS — Z113 Encounter for screening for infections with a predominantly sexual mode of transmission: Secondary | ICD-10-CM | POA: Diagnosis not present

## 2017-05-19 ENCOUNTER — Telehealth: Payer: Self-pay | Admitting: Neurology

## 2017-05-19 NOTE — Telephone Encounter (Signed)
Received FMLA paperwork on patient. She has not been seen since January. She cancelled her May appt. Does not have follow up scheduled. Per Dr. Everlena CooperJaffe he will not complete forms. Patient made aware. Aware he would be happy to see her back if she is still having a problem with headaches. She will call back to schedule.

## 2017-07-19 ENCOUNTER — Encounter (HOSPITAL_COMMUNITY): Payer: Self-pay | Admitting: Emergency Medicine

## 2017-07-19 DIAGNOSIS — F17200 Nicotine dependence, unspecified, uncomplicated: Secondary | ICD-10-CM | POA: Insufficient documentation

## 2017-07-19 DIAGNOSIS — B373 Candidiasis of vulva and vagina: Secondary | ICD-10-CM | POA: Insufficient documentation

## 2017-07-19 DIAGNOSIS — N898 Other specified noninflammatory disorders of vagina: Secondary | ICD-10-CM | POA: Diagnosis not present

## 2017-07-19 DIAGNOSIS — Z885 Allergy status to narcotic agent status: Secondary | ICD-10-CM | POA: Insufficient documentation

## 2017-07-19 DIAGNOSIS — M5417 Radiculopathy, lumbosacral region: Secondary | ICD-10-CM | POA: Diagnosis not present

## 2017-07-19 DIAGNOSIS — Z79899 Other long term (current) drug therapy: Secondary | ICD-10-CM | POA: Diagnosis not present

## 2017-07-19 DIAGNOSIS — M79604 Pain in right leg: Secondary | ICD-10-CM | POA: Diagnosis not present

## 2017-07-19 NOTE — ED Triage Notes (Signed)
Pt c/o right leg pain and numbness that began this am. Pt also c/o vaginal itching and swelling after using Monistat last night. Pt never used Monistat before.

## 2017-07-20 ENCOUNTER — Emergency Department (HOSPITAL_COMMUNITY)
Admission: EM | Admit: 2017-07-20 | Discharge: 2017-07-20 | Disposition: A | Discharge status: Home / Self Care | Payer: 59 | Attending: Emergency Medicine | Admitting: Emergency Medicine

## 2017-07-20 DIAGNOSIS — B373 Candidiasis of vulva and vagina: Secondary | ICD-10-CM | POA: Diagnosis not present

## 2017-07-20 DIAGNOSIS — Z885 Allergy status to narcotic agent status: Secondary | ICD-10-CM | POA: Diagnosis not present

## 2017-07-20 DIAGNOSIS — B3731 Acute candidiasis of vulva and vagina: Secondary | ICD-10-CM

## 2017-07-20 DIAGNOSIS — M5417 Radiculopathy, lumbosacral region: Secondary | ICD-10-CM | POA: Diagnosis not present

## 2017-07-20 DIAGNOSIS — M79604 Pain in right leg: Secondary | ICD-10-CM | POA: Diagnosis not present

## 2017-07-20 DIAGNOSIS — N898 Other specified noninflammatory disorders of vagina: Secondary | ICD-10-CM | POA: Diagnosis not present

## 2017-07-20 DIAGNOSIS — F17200 Nicotine dependence, unspecified, uncomplicated: Secondary | ICD-10-CM | POA: Diagnosis not present

## 2017-07-20 DIAGNOSIS — Z79899 Other long term (current) drug therapy: Secondary | ICD-10-CM | POA: Diagnosis not present

## 2017-07-20 LAB — URINALYSIS, ROUTINE W REFLEX MICROSCOPIC
Bilirubin Urine: NEGATIVE
GLUCOSE, UA: NEGATIVE mg/dL
Ketones, ur: NEGATIVE mg/dL
Nitrite: NEGATIVE
Protein, ur: NEGATIVE mg/dL
SPECIFIC GRAVITY, URINE: 1.017 (ref 1.005–1.030)
pH: 6 (ref 5.0–8.0)

## 2017-07-20 LAB — GC/CHLAMYDIA PROBE AMP (~~LOC~~) NOT AT ARMC
Chlamydia: NEGATIVE
Neisseria Gonorrhea: NEGATIVE

## 2017-07-20 LAB — WET PREP, GENITAL
CLUE CELLS WET PREP: NONE SEEN
SPERM: NONE SEEN
TRICH WET PREP: NONE SEEN

## 2017-07-20 LAB — PREGNANCY, URINE: Preg Test, Ur: NEGATIVE

## 2017-07-20 MED ORDER — CYCLOBENZAPRINE HCL 10 MG PO TABS: 10.0000 mg | ORAL_TABLET | Freq: Two times a day (BID) | ORAL | 0 refills | Status: DC | PRN

## 2017-07-20 MED ORDER — PREDNISONE 20 MG PO TABS
60.0000 mg | ORAL_TABLET | Freq: Once | ORAL | Status: AC
  Administered 2017-07-20: 60 mg via ORAL
  Filled 2017-07-20: qty 3

## 2017-07-20 MED ORDER — FLUCONAZOLE 150 MG PO TABS: 150.0000 mg | ORAL_TABLET | Freq: Every day | ORAL | 0 refills | Status: AC

## 2017-07-20 MED ORDER — PREDNISONE 20 MG PO TABS: 40.0000 mg | ORAL_TABLET | Freq: Every day | ORAL | 0 refills | Status: DC

## 2017-07-20 MED ORDER — FLUCONAZOLE 100 MG PO TABS
150.0000 mg | ORAL_TABLET | Freq: Once | ORAL | Status: AC
  Administered 2017-07-20: 150 mg via ORAL
  Filled 2017-07-20: qty 2

## 2017-07-20 NOTE — Discharge Instructions (Signed)
Take prednisone as prescribed until all gone for nerve inflammation. Take Flexeril for muscle spasms. Take Tylenol for pain. Take Diflucan as prescribed, for yeast infection. Follow-up with family doctor as needed.

## 2017-07-20 NOTE — ED Provider Notes (Signed)
[facility]-EMERGENCY DEPT Provider Note   CSN: [id number] Arrival date & time: 07/19/17  2309     History   Chief Complaint Chief Complaint  Patient presents with  . Leg Pain  . Vaginal Itching    HPI Christy Leblanc is a 46 y.o. female.  HPI  Christy Leblanc is a 46 y.o. femalepresents to emergency department with 2 separate complaints. Patient states her first complaint is of right leg pain. She states she woke up yesterday morning with tingling sensation, states "feels like pins and needles" in the back of my leg. She states it is radiating from the hip, behind the leg, into the calf. She reports some tingling in her toes earlier which has improved. She also reports pain to the back of her leg. She denies any injuries. She states that she does have history of herniated disc and had to go to a chiropractor and it improved. She states the symptoms feel similar. Patient's second complaint is vaginal itching and swelling. She states that she noticed some discharge 3 days ago after buying and wearing new clothes. She thought that she may have a yeast infection a use Monistat cream 2 days ago. When she woke up yesterday morning, she has noticed that there is increased swelling to her private area. She denies any discharge. She denies any bleeding. History of tubal ligation. Denies urinary symptoms. Denies being pregnant. States she has not had intercourse in multiple months. Does not believe she could have STD.   Past Medical History:  Diagnosis Date  . Headache     Patient Active Problem List   Diagnosis Date Noted  . Chronic migraine without aura without status migrainosus, not intractable 05/08/2015  . Tobacco abuse 05/08/2015    Past Surgical History:  Procedure Laterality Date  . TUBAL LIGATION      OB History    No data available       Home Medications    Prior to Admission medications   Medication Sig Start Date End Date Taking? Authorizing Provider  omeprazole (PRILOSEC)  20 MG capsule Take 20 mg by mouth daily. 08/05/15   [provider]  rizatriptan (MAXALT-MLT) 10 MG disintegrating tablet Take 1 tablet (10 mg total) by mouth as needed for migraine. May repeat in 2 hours if needed 12/27/16   [name], DO  SUMAtriptan Succinate (IMITREX PO) Take by mouth.    [provider]  terbinafine (LAMISIL) 250 MG tablet  08/12/15   [provider]  topiramate (TOPAMAX) 50 MG tablet Take one tablet at bedtime for 7 days then 2 tablets at bedtime 12/27/16   [name], DO    Family History No family history on file.  Social History Social History  Substance Use Topics  . Smoking status: Current Some Day Smoker  . Smokeless tobacco: Never Used  . Alcohol use Yes     Comment: occasional     Allergies   Other and Morphine and related   Review of Systems Review of Systems  Constitutional: Negative for chills and fever.  Respiratory: Negative for cough, chest tightness and shortness of breath.   Cardiovascular: Negative for chest pain, palpitations and leg swelling.  Gastrointestinal: Negative for abdominal pain, diarrhea, nausea and vomiting.  Genitourinary: Positive for vaginal pain. Negative for dysuria, flank pain, pelvic pain, vaginal bleeding and vaginal discharge.  Musculoskeletal: Positive for arthralgias. Negative for back pain, myalgias, neck pain and neck stiffness.  Skin: Negative for rash.  Neurological: Positive for numbness.  Negative for dizziness, weakness and headaches.  All other systems reviewed and are negative.    Physical Exam Updated Vital Signs BP (!) 129/91   Pulse 100   Temp 98.5 F (36.9 C) (Oral)   Resp 18   Ht 5\' 3"  (1.6 m)   Wt 65.3 kg (144 lb)   SpO2 100%   BMI 25.51 kg/m   Physical Exam  Constitutional: She is oriented to person, place, and time. She appears well-developed and well-nourished. No distress.  HENT:  Head: Normocephalic.  Eyes: Conjunctivae are normal.  Neck: Neck  supple.  Cardiovascular: Normal rate, regular rhythm and normal heart sounds.   Pulmonary/Chest: Effort normal and breath sounds normal. No respiratory distress. She has no wheezes. She has no rales.  Abdominal: Soft. Bowel sounds are normal. She exhibits no distension. There is no tenderness. There is no rebound.  Genitourinary:  Genitourinary Comments: Normal external genitalia. Erythematous vaginal canal. Large white crud like discharge. Cervix is normal, closed. No CMT. No uterine or adnexal tenderness. No masses palpated.    Musculoskeletal: She exhibits no edema.  Neurological: She is alert and oriented to person, place, and time.  Normal-appearing right lower leg, distal DP pulse intact. "Tingling" sensation with palpation to the posterior thigh and posterior knee and calf. Restless sensation over the right leg is normal when compared to the left. Patellar reflex is 2+ and equal bilaterally. Strength is intact with plantar flexion and dorsiflexion of the right foot against resistance, with extension of the right knee, flexion of the knee. No pain with right straight leg raise or internal/external rotation of the hip. Strength is intact with hip flexors.  Skin: Skin is warm and dry.  Psychiatric: She has a normal mood and affect. Her behavior is normal.  Nursing note and vitals reviewed.    ED Treatments / Results  Labs (all labs ordered are listed, but only abnormal results are displayed) Labs Reviewed  URINALYSIS, ROUTINE W REFLEX MICROSCOPIC - Abnormal; Notable for the following:       Result Value   APPearance HAZY (*)    Hgb urine dipstick SMALL (*)    Leukocytes, UA LARGE (*)    Bacteria, UA FEW (*)    Squamous Epithelial / LPF 0-5 (*)    All other components within normal limits  WET PREP, GENITAL  PREGNANCY, URINE  GC/CHLAMYDIA PROBE AMP ([place]) NOT AT [facility]    EKG  EKG Interpretation None       Radiology No results found.  Procedures Procedures  (including critical care time)  Medications Ordered in ED Medications  predniSONE (DELTASONE) tablet 60 mg (not administered)     Initial Impression / Assessment and Plan / ED Course  I have reviewed the triage vital signs and the nursing notes.  Pertinent labs & imaging results that were available during my care of the patient were reviewed by me and considered in my medical decision making (see chart for details).     Pt in ED with two separate complaints. Pt is complaing of pain going down right leg. Hx of the same. Reports diagnosis of "herniated disk." Exam and symptoms consistent with recurrence. At this time no evidence of cauda equina. Will place on a short burst of steroids and muscle relaxants. Patient is also complaining of vaginal discharge. Exam is most consistent with Candida vaginitis. Wet prep showed yeast. Will place on oral Diflucan since she had a reaction to Monistat. Dose given in emergency department. Due to severity  of discharge and itching, will give 2 more tablets to take at home. Instructed to follow-up with doctor as needed. Gonorrhea and Chlamydia cultures are pending.  Vitals:   07/19/17 2338 07/20/17 0130 07/20/17 0200 07/20/17 0230  BP: 132/87 (!) 129/91 (!) 138/95 (!) 132/98  Pulse: 100  81 84  Resp: 18     Temp: 98.5 F (36.9 C)     TempSrc: Oral     SpO2: 100%  99% 100%  Weight: 65.3 kg (144 lb)     Height: 5\' 3"  (1.6 m)        Final Clinical Impressions(s) / ED Diagnoses   Final diagnoses:  Lumbosacral radiculopathy  Vaginal yeast infection    New Prescriptions New Prescriptions   CYCLOBENZAPRINE (FLEXERIL) 10 MG TABLET    Take 1 tablet (10 mg total) by mouth 2 (two) times daily as needed for muscle spasms.   FLUCONAZOLE (DIFLUCAN) 150 MG TABLET    Take 1 tablet (150 mg total) by mouth daily.   PREDNISONE (DELTASONE) 20 MG TABLET    Take 2 tablets (40 mg total) by mouth daily.     Jaynie Crumble, PA-C 07/20/17 1610    Gilda Crease, MD 07/20/17 [id number]

## 2017-07-27 ENCOUNTER — Encounter (HOSPITAL_COMMUNITY): Payer: Self-pay

## 2017-07-27 ENCOUNTER — Emergency Department (HOSPITAL_COMMUNITY)
Admission: EM | Admit: 2017-07-27 | Discharge: 2017-07-27 | Disposition: A | Discharge status: Home / Self Care | Payer: 59 | Attending: Emergency Medicine | Admitting: Emergency Medicine

## 2017-07-27 DIAGNOSIS — Z5321 Procedure and treatment not carried out due to patient leaving prior to being seen by health care provider: Secondary | ICD-10-CM | POA: Diagnosis not present

## 2017-07-27 DIAGNOSIS — M549 Dorsalgia, unspecified: Secondary | ICD-10-CM | POA: Insufficient documentation

## 2017-07-27 NOTE — ED Notes (Signed)
No answer for re-assessment.

## 2017-07-27 NOTE — ED Triage Notes (Signed)
Pt states that she was seen last week for back spasms and was given medications that is no longer working. Pain is worse, denies injury to back. Pain is in thoracic region to the right.

## 2017-08-21 DIAGNOSIS — R8299 Other abnormal findings in urine: Secondary | ICD-10-CM | POA: Diagnosis not present

## 2017-08-21 DIAGNOSIS — N760 Acute vaginitis: Secondary | ICD-10-CM | POA: Diagnosis not present

## 2017-08-21 DIAGNOSIS — Z130 Encounter for screening for diseases of the blood and blood-forming organs and certain disorders involving the immune mechanism: Secondary | ICD-10-CM | POA: Diagnosis not present

## 2017-08-21 DIAGNOSIS — Z1231 Encounter for screening mammogram for malignant neoplasm of breast: Secondary | ICD-10-CM | POA: Diagnosis not present

## 2017-08-21 DIAGNOSIS — Z1389 Encounter for screening for other disorder: Secondary | ICD-10-CM | POA: Diagnosis not present

## 2017-08-21 DIAGNOSIS — Z01419 Encounter for gynecological examination (general) (routine) without abnormal findings: Secondary | ICD-10-CM | POA: Diagnosis not present

## 2017-11-12 IMAGING — CR DG HIP (WITH OR WITHOUT PELVIS) 2-3V*R*
3 series · 3 of 3 positions shown · non-contrast
Comparison: None.

CLINICAL DATA: Fall down stairs 1 week ago with right hip pain.

EXAM:
DG HIP (WITH OR WITHOUT PELVIS) 2-3V RIGHT

[pelvis ap]
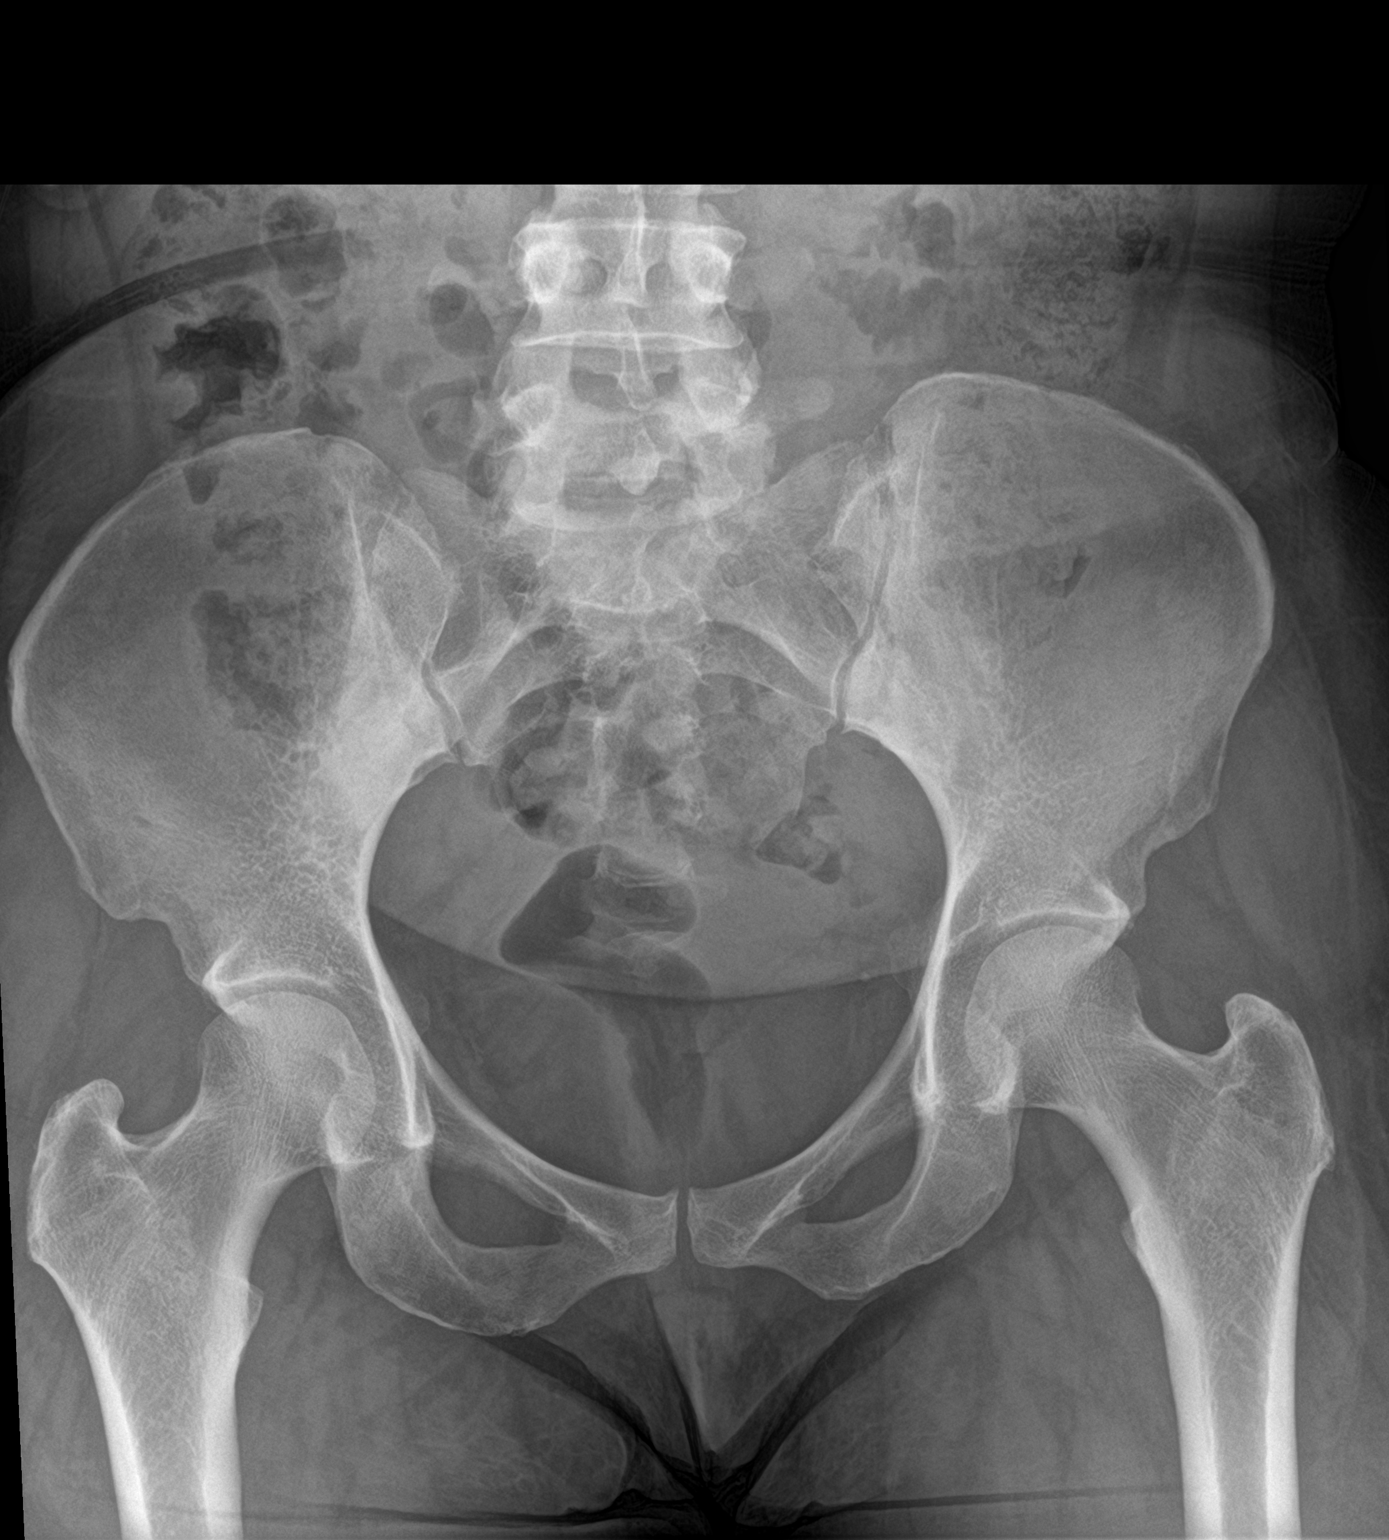

[hip ap]
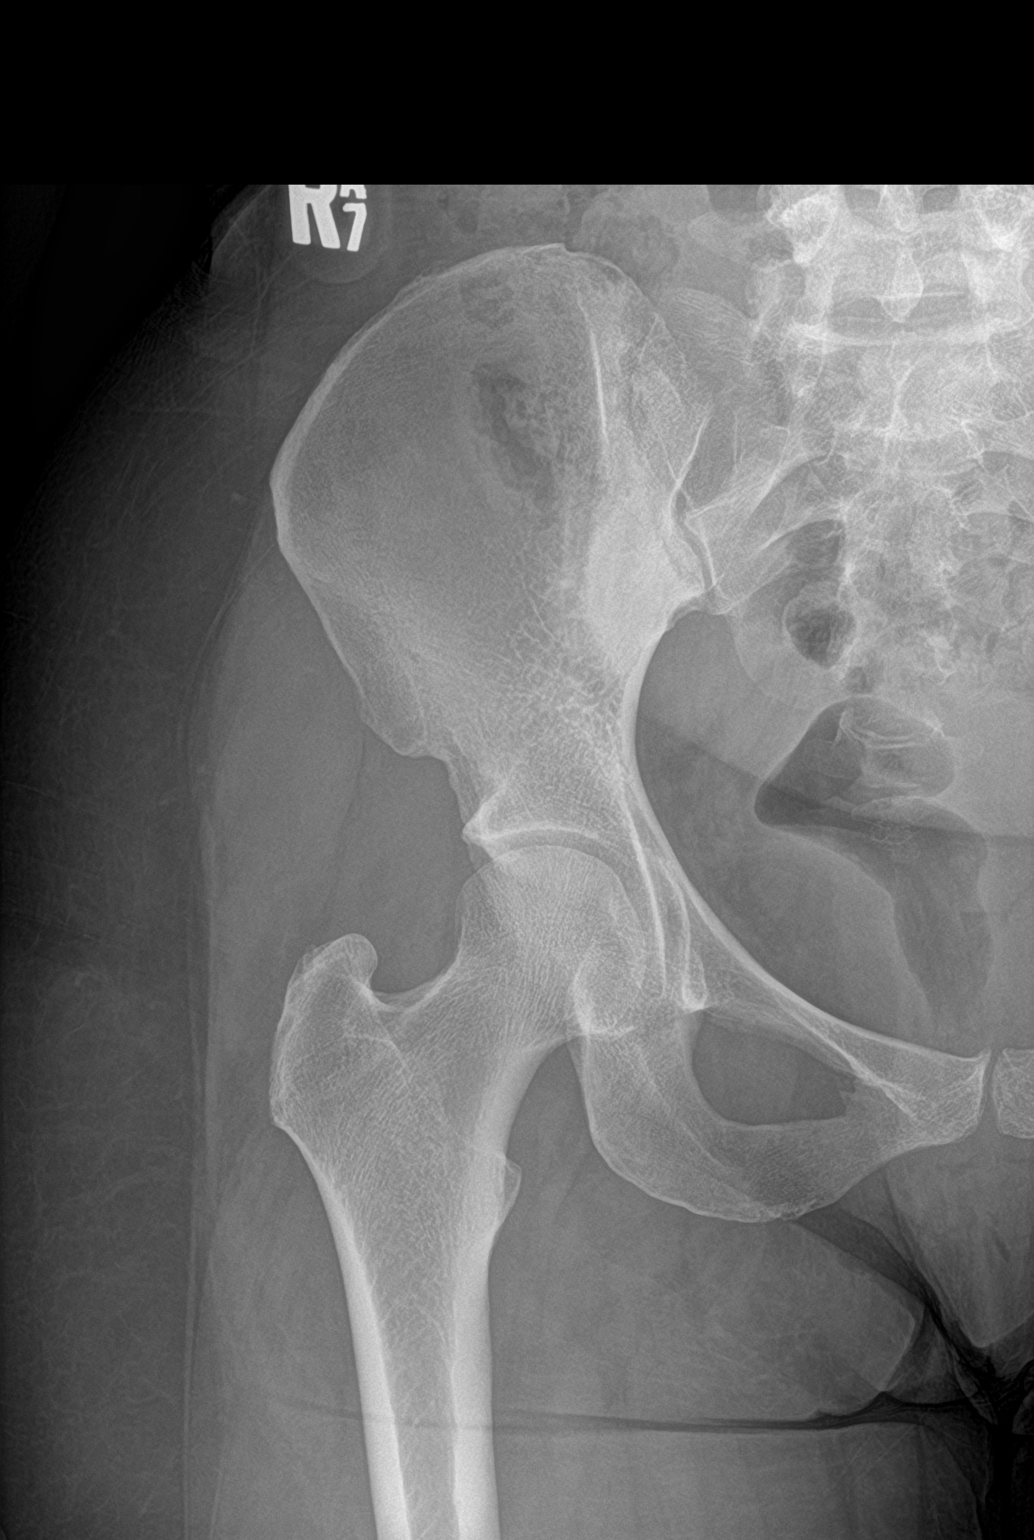

[hip lat]
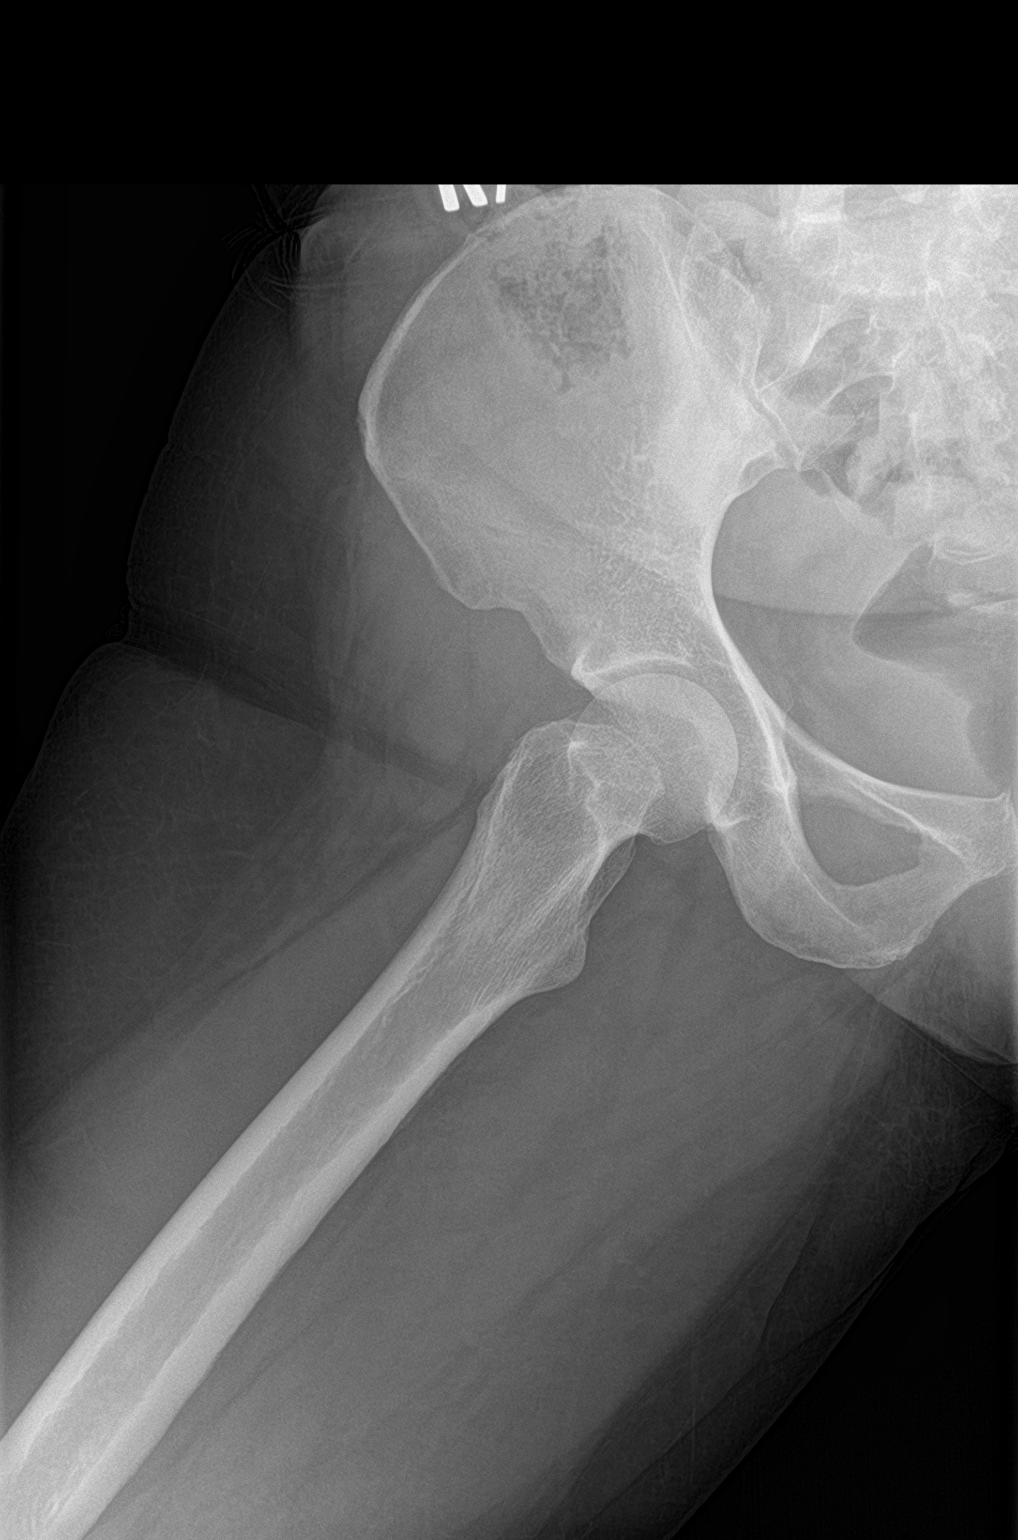

[3 of 3 positions shown; findings below may reference images not displayed]

FINDINGS: No evidence of fracture or dislocation involving the right hip.
Minimal sclerosis along the iliac side of the lower sacroiliac
joints likely osteitis condensans ilii.
IMPRESSION: No acute findings.

## 2017-11-12 IMAGING — CR DG LUMBAR SPINE COMPLETE 4+V
5 series · 5 of 5 positions shown · non-contrast
Comparison: 01/26/2009

CLINICAL DATA: Fall down stairs 1 week ago with low back pain
getting worse.

EXAM:
LUMBAR SPINE - COMPLETE 4+ VIEW

[l-spine ap]
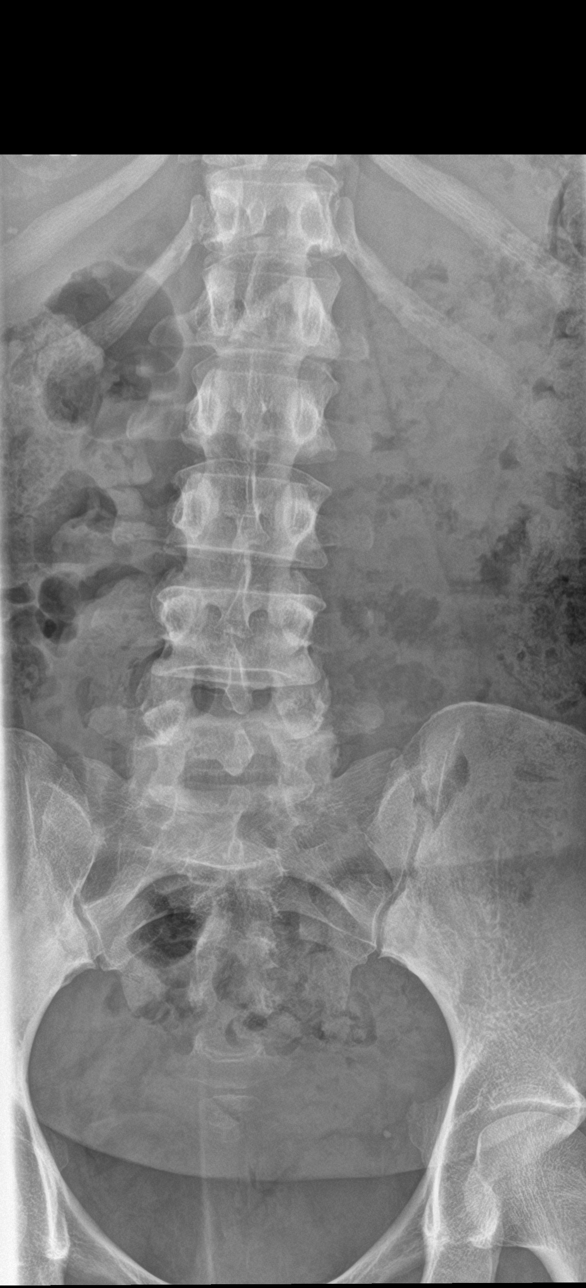

[l-spine obl (1 of 2)]
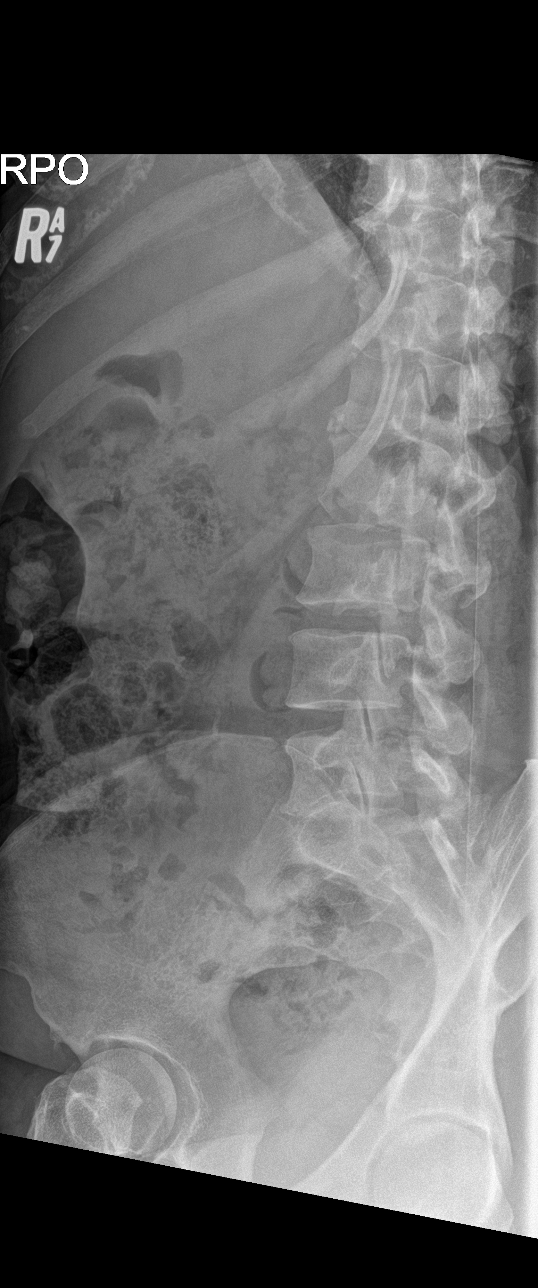

[l-spine obl (2 of 2)]
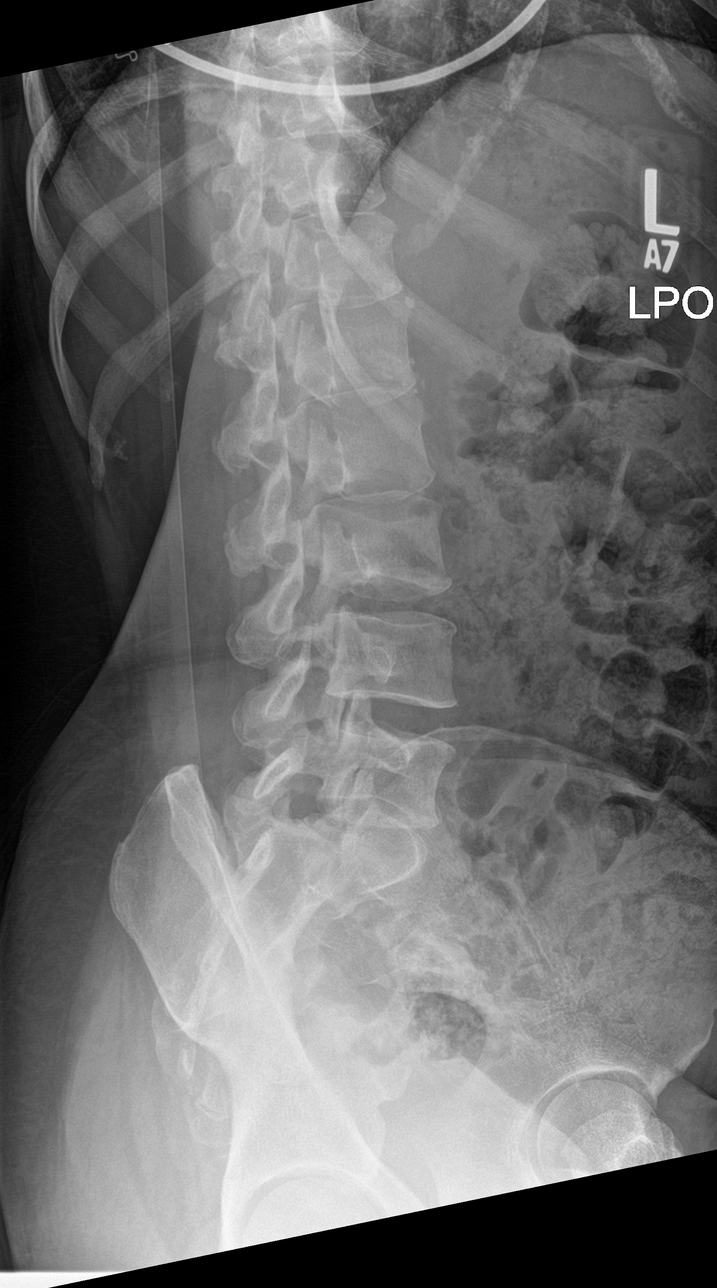

[l-spine lat]
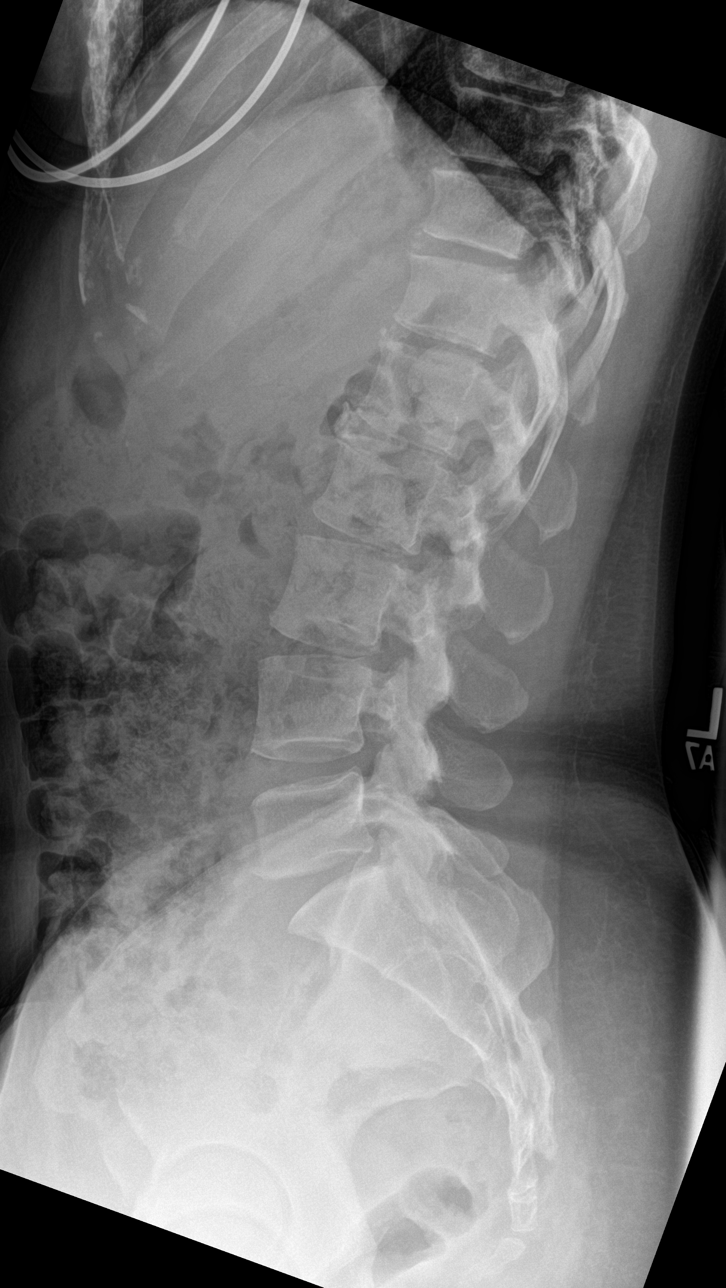

[l-spine spot]
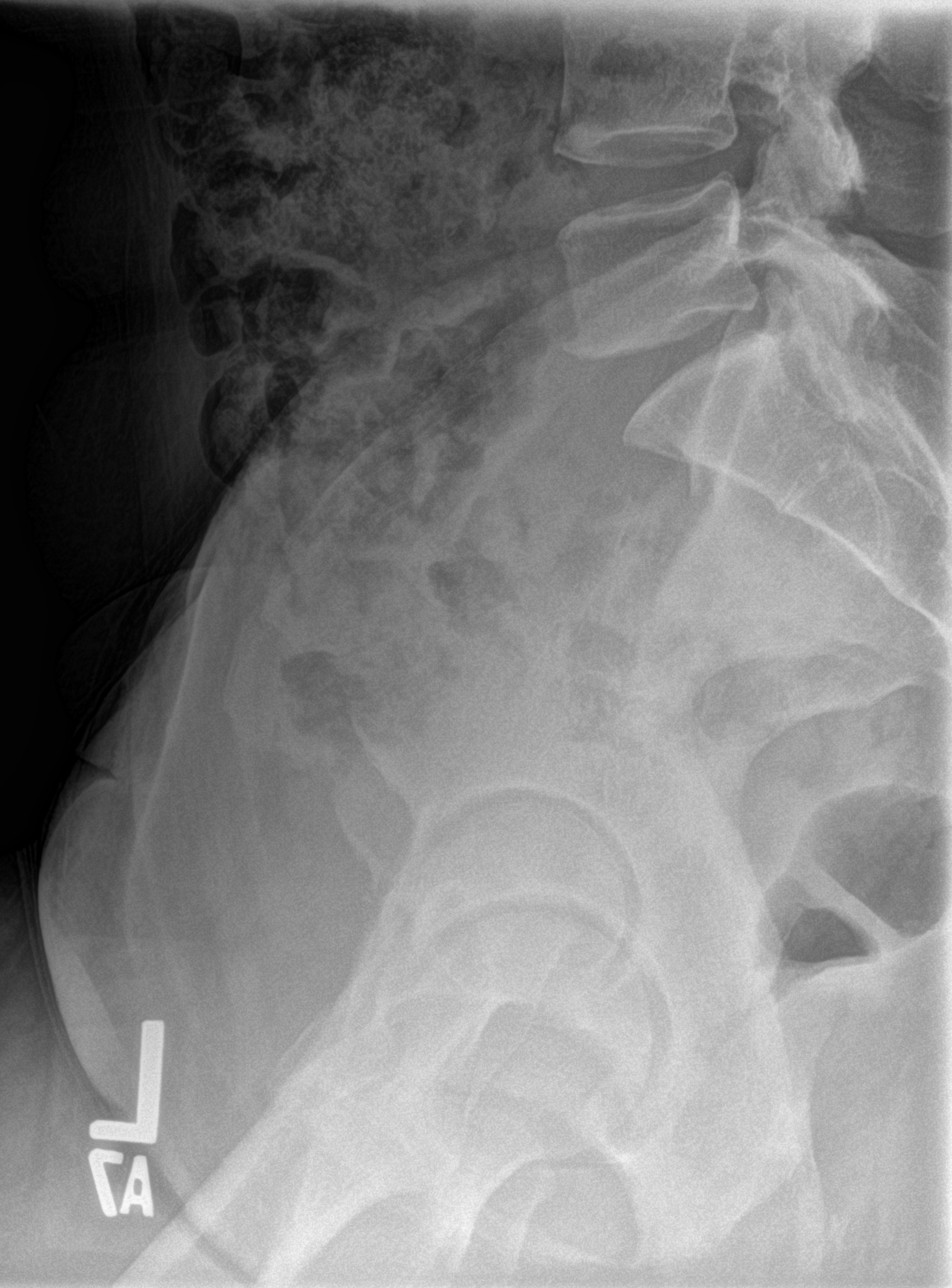

[5 of 5 positions shown; findings below may reference images not displayed]

FINDINGS: Vertebral body alignment, heights and disc space heights are within
normal. There is mild spondylosis present. There is no acute
fracture or subluxation. Possible right nephrolithiasis.
IMPRESSION: Minimal spondylosis of the lumbar spine.  No acute findings.

Possible right nephrolithiasis.

## 2018-02-26 ENCOUNTER — Encounter (HOSPITAL_COMMUNITY): Payer: Self-pay | Admitting: *Deleted

## 2018-02-26 ENCOUNTER — Other Ambulatory Visit: Payer: Self-pay

## 2018-02-26 ENCOUNTER — Emergency Department (HOSPITAL_COMMUNITY)
Admission: EM | Admit: 2018-02-26 | Discharge: 2018-02-26 | Disposition: A | Discharge status: Home / Self Care | Payer: Self-pay | Attending: Emergency Medicine | Admitting: Emergency Medicine

## 2018-02-26 DIAGNOSIS — A5901 Trichomonal vulvovaginitis: Secondary | ICD-10-CM | POA: Insufficient documentation

## 2018-02-26 DIAGNOSIS — F17210 Nicotine dependence, cigarettes, uncomplicated: Secondary | ICD-10-CM | POA: Insufficient documentation

## 2018-02-26 DIAGNOSIS — A599 Trichomoniasis, unspecified: Secondary | ICD-10-CM

## 2018-02-26 LAB — CBC
HCT: 43 % (ref 36.0–46.0)
Hemoglobin: 14 g/dL (ref 12.0–15.0)
MCH: 31.3 pg (ref 26.0–34.0)
MCHC: 32.6 g/dL (ref 30.0–36.0)
MCV: 96 fL (ref 78.0–100.0)
PLATELETS: 282 10*3/uL (ref 150–400)
RBC: 4.48 MIL/uL (ref 3.87–5.11)
RDW: 12.6 % (ref 11.5–15.5)
WBC: 10.3 10*3/uL (ref 4.0–10.5)

## 2018-02-26 LAB — URINALYSIS, ROUTINE W REFLEX MICROSCOPIC
Bilirubin Urine: NEGATIVE
Glucose, UA: NEGATIVE mg/dL
Hgb urine dipstick: NEGATIVE
Ketones, ur: 5 mg/dL — AB
Nitrite: NEGATIVE
PH: 6 (ref 5.0–8.0)
Protein, ur: NEGATIVE mg/dL
Specific Gravity, Urine: 1.025 (ref 1.005–1.030)

## 2018-02-26 LAB — WET PREP, GENITAL
Clue Cells Wet Prep HPF POC: NONE SEEN
Sperm: NONE SEEN
Yeast Wet Prep HPF POC: NONE SEEN

## 2018-02-26 LAB — COMPREHENSIVE METABOLIC PANEL
ALT: 25 U/L (ref 14–54)
AST: 25 U/L (ref 15–41)
Albumin: 3.9 g/dL (ref 3.5–5.0)
Alkaline Phosphatase: 80 U/L (ref 38–126)
Anion gap: 11 (ref 5–15)
BILIRUBIN TOTAL: 0.7 mg/dL (ref 0.3–1.2)
BUN: 5 mg/dL — AB (ref 6–20)
CHLORIDE: 102 mmol/L (ref 101–111)
CO2: 24 mmol/L (ref 22–32)
CREATININE: 0.74 mg/dL (ref 0.44–1.00)
Calcium: 9 mg/dL (ref 8.9–10.3)
GFR calc Af Amer: >60 mL/min (ref >60)
Glucose, Bld: 105 mg/dL — ABNORMAL HIGH (ref 65–99)
Potassium: 3.8 mmol/L (ref 3.5–5.1)
Sodium: 137 mmol/L (ref 135–145)
Total Protein: 6.6 g/dL (ref 6.5–8.1)

## 2018-02-26 LAB — LIPASE, BLOOD: LIPASE: 27 U/L (ref 11–51)

## 2018-02-26 LAB — I-STAT BETA HCG BLOOD, ED (MC, WL, AP ONLY): I-stat hCG, quantitative: <5 m[IU]/mL (ref <5)

## 2018-02-26 MED ORDER — METRONIDAZOLE 500 MG PO TABS: 500.0000 mg | ORAL_TABLET | Freq: Two times a day (BID) | ORAL | 0 refills | Status: DC

## 2018-02-26 NOTE — ED Notes (Signed)
Pelvic Done by Trey PaulaJeff - PA and Natasha MeadJeri NT assisted.

## 2018-02-26 NOTE — ED Provider Notes (Addendum)
[facility] EMERGENCY DEPARTMENT Provider Note   CSN: [id number] Arrival date & time: 02/26/18  1500    History   Chief Complaint Chief Complaint  Patient presents with  . Abdominal Pain  . Vaginal Discharge    HPI Christy Leblanc is a 47 y.o. female.  HPI   80 [name] presents today with complaints of vaginal discharge.  Patient reports approximately 1 week ago she completed a course of antibiotics for dental infection.  She notes shortly after that she started having yellow and white discharge there is odorous.  She denies any dysuria, vaginal itching, bleeding.  Patient reports a vague sensation of abdominal discomfort, nonsevere.  She denies any fever nausea vomiting or upper abdominal symptoms.  Patient reports she is not sexually active and has not been recently.  Patient denies any urinary symptoms.   Past Medical History:  Diagnosis Date  . Headache     Patient Active Problem List   Diagnosis Date Noted  . Chronic migraine without aura without status migrainosus, not intractable 05/08/2015  . Tobacco abuse 05/08/2015    Past Surgical History:  Procedure Laterality Date  . CESAREAN SECTION    . TUBAL LIGATION       OB History   None      Home Medications    Prior to Admission medications   Medication Sig Start Date End Date Taking? Authorizing Provider  cyclobenzaprine (FLEXERIL) 10 MG tablet Take 1 tablet (10 mg total) by mouth 2 (two) times daily as needed for muscle spasms. 07/20/17   [name], [name], PA-C  metroNIDAZOLE (FLAGYL) 500 MG tablet Take 1 tablet (500 mg total) by mouth 2 (two) times daily. 02/26/18   [name], [name], PA-C  predniSONE (DELTASONE) 20 MG tablet Take 2 tablets (40 mg total) by mouth daily. 07/20/17   [name], [name], PA-C  rizatriptan (MAXALT-MLT) 10 MG disintegrating tablet Take 1 tablet (10 mg total) by mouth as needed for migraine. May repeat in 2 hours if needed 12/27/16   [name], DO  topiramate  (TOPAMAX) 50 MG tablet Take one tablet at bedtime for 7 days then 2 tablets at bedtime Patient taking differently: Take 100 mg by mouth at bedtime.  12/27/16   [name], DO    Family History No family history on file.  Social History Social History   Tobacco Use  . Smoking status: Current Some Day Smoker    Packs/day: 0.25    Types: Cigarettes  . Smokeless tobacco: Never Used  Substance Use Topics  . Alcohol use: Yes    Comment: occasional  . Drug use: No     Allergies   Other and Morphine and related   Review of Systems Review of Systems  All other systems reviewed and are negative.    Physical Exam Updated Vital Signs BP (!) 124/96 (BP Location: Right Arm)   Pulse 91   Temp 97.8 F (36.6 C) (Oral)   Resp 16   Ht 5\' 3"  (1.6 m)   Wt 69.9 kg (154 lb)   LMP 02/26/2005   SpO2 100%   BMI 27.28 kg/m   Physical Exam  Constitutional: She is oriented to person, place, and time. She appears well-developed and well-nourished.  HENT:  Head: Normocephalic and atraumatic.  Eyes: Pupils are equal, round, and reactive to light. Conjunctivae are normal. Right eye exhibits no discharge. Left eye exhibits no discharge. No scleral icterus.  Neck: Normal range of motion. No JVD present. No tracheal deviation present.  Pulmonary/Chest: Effort normal. No stridor.  Abdominal: Soft. She exhibits no distension and no mass. There is no tenderness. There is no rebound and no guarding.  Genitourinary:  Genitourinary Comments: Purulent vaginal discharge noted in the vaginal vault, no cervical or adnexal tenderness or masses  Neurological: She is alert and oriented to person, place, and time. Coordination normal.  Psychiatric: She has a normal mood and affect. Her behavior is normal. Judgment and thought content normal.  Nursing note and vitals reviewed.    ED Treatments / Results  Labs (all labs ordered are listed, but only abnormal results are displayed) Labs Reviewed  WET  PREP, GENITAL - Abnormal; Notable for the following components:      Result Value   Trich, Wet Prep RESIDENT (*)    WBC, Wet Prep HPF POC MANY (*)    All other components within normal limits  COMPREHENSIVE METABOLIC PANEL - Abnormal; Notable for the following components:   Glucose, Bld 105 (*)    BUN 5 (*)    All other components within normal limits  URINALYSIS, ROUTINE W REFLEX MICROSCOPIC - Abnormal; Notable for the following components:   Ketones, ur 5 (*)    Leukocytes, UA MODERATE (*)    Bacteria, UA FEW (*)    Squamous Epithelial / LPF 0-5 (*)    All other components within normal limits  LIPASE, BLOOD  CBC  I-STAT BETA HCG BLOOD, ED ([facility], [facility], [facility] ONLY)  GC/CHLAMYDIA PROBE AMP ([place]) NOT AT [facility]    EKG None  Radiology No results found.  Procedures Procedures (including critical care time)  Medications Ordered in ED Medications - No data to display   Initial Impression / Assessment and Plan / ED Course  I have reviewed the triage vital signs and the nursing notes.  Pertinent labs & imaging results that were available during my care of the patient were reviewed by me and considered in my medical decision making (see chart for details).     Labs: Wet prep, i-STAT beta-hCG, GC, urinalysis  Imaging:  Consults:  Therapeutics:  Discharge Meds: Metronidazole  Assessment/Plan: 47 year old female presents today with vaginal discharge.  She has purulence in her vaginal vault consistent with STD.  She has wet prep showing trichomonas.  Discussed this with the patient, she is adamant that she is not sexually active and has not been recently.  She notes she has been treated for trichomoniasis and denies this in the past with recurrence even without sexual activity.  I discussed the indications to treat for other STDs as well patient does not want to wait for any further treatment and will follow-up as an outpatient.  I encouraged her to follow-up with health  department as she needs continued testing and treatment.  Patient is given strict return precautions, she verbalized understanding and agreement to today's plan.      Final Clinical Impressions(s) / ED Diagnoses   Final diagnoses:  Trichomoniasis    ED Discharge Orders        Ordered    metroNIDAZOLE (FLAGYL) 500 MG tablet  2 times daily     02/26/18 1947       Eyvonne MechanicHedges, Kesley Mullens, PA-C 02/26/18 2024    Jannatul Wojdyla, Tinnie GensJeffrey, PA-C 02/26/18 2024    Mancel BaleWentz, Elliott, MD 02/28/18 2035

## 2018-02-26 NOTE — ED Triage Notes (Signed)
PT states increased vaginal discharge, lower back pain and some lower abdominal discomfort starting the last 2 weeks of March.  Denies itching, foul odor, or changes in bowel or bladder habits.  Feels it is r/t recent amoxicillin rx.

## 2018-02-26 NOTE — Discharge Instructions (Addendum)
Please read attached information. If you experience any new or worsening signs or symptoms please return to the emergency room for evaluation. Please follow-up with your primary care provider or specialist as discussed. Please use medication prescribed only as directed and discontinue taking if you have any concerning signs or symptoms.   °

## 2018-02-27 LAB — GC/CHLAMYDIA PROBE AMP (~~LOC~~) NOT AT ARMC
Chlamydia: NEGATIVE
Neisseria Gonorrhea: NEGATIVE

## 2019-07-01 ENCOUNTER — Other Ambulatory Visit: Payer: Self-pay | Admitting: Family Medicine

## 2019-07-01 DIAGNOSIS — R109 Unspecified abdominal pain: Secondary | ICD-10-CM

## 2019-07-10 ENCOUNTER — Ambulatory Visit
Admission: RE | Admit: 2019-07-10 | Discharge: 2019-07-10 | Disposition: A | Discharge status: Home / Self Care | Payer: Self-pay | Source: Ambulatory Visit | Attending: Family Medicine | Admitting: Family Medicine

## 2019-07-10 ENCOUNTER — Other Ambulatory Visit: Payer: Self-pay

## 2019-07-10 DIAGNOSIS — R109 Unspecified abdominal pain: Secondary | ICD-10-CM

## 2019-08-07 NOTE — Progress Notes (Unsigned)
Virtual Visit via Video Note The purpose of this virtual visit is to provide medical care while limiting exposure to the novel coronavirus.    Consent was obtained for video visit:  Yes Answered questions that patient had about telehealth interaction:  Yes I discussed the limitations, risks, security and privacy concerns of performing an evaluation and management service by telemedicine. I also discussed with the patient that there may be a patient responsible charge related to this service. The patient expressed understanding and agreed to proceed.  Pt location: Home Physician Location: office Name of referring provider:  Nolene Ebbs, MD I connected with Christy Leblanc at patients initiation/request on 08/08/2019 at  8:50 AM EDT by video enabled telemedicine application and verified that I am speaking with the correct person using two identifiers. Pt MRN:  [id number] Pt DOB:  1971/10/22 Video Participants:  [facility]   History of Present Illness:  Christy Leblanc is a 48 year old right-handed woman who is a current smoker who follows up for cervicogenic migraines.  UPDATE: I have not seen the patient since January 2018.  At that time, I restarted her on topiramate and Maxalt.   She did not keep follow up appointment.  *** Intensity: 10/10 Duration: all day Frequency: 4 days a week Current abortive medication: no Antihypertensive medications: no Antidepressant medications: none Anticonvulsant medications: no Vitamins/Herbal/Supplements: none Other therapy: none  Caffeine: Coffee daily Alcohol: no Smoker: Current smoker Diet: Only eats dinner. Drinks water Exercise: Not routine Depression/stress: Family stress Sleep hygiene: poor  HISTORY: Onset:  Since 48 years old old following a head injury with mild concussion. Cans in a grocery store fell on her head. Location: Starts bilaterally in back of neck and travels up to the front.  Sometimes just right-sided or left-sided Quality: Throbbing, pressure Initial intensity: 9-10/10; September 2016: 9-10/10 Aura: no Prodrome: no Associated symptoms: Head feels tingling, photophobia, phonophobia, phonosensitivity in right ear. Blurred vision in either eye.  No nausea Initial Duration: All day; September 2016: Less than 2 hours with Maxalt Initial Frequency: every other day; September 2016: 15 headache days per month (10 days severe) Triggers/exacerbating factors: Smells of cleaning fluid and noise from residence trigger it. Relieving factors: Keeping still Activity: Functions but rather would lay down  Past abortive therapy: sumatriptan 50mg , Tylenol, meloxicam, diclofenac, Vicodin, naproxen, Maxalt MLT 10mg  (effective), Excedrin Past preventative therapy: topiramate 100mg  (effective)  CT of head from 04/15/15 was normal.   Family history of headache: Mother. No family history of aneurysms.  Past Medical History: Past Medical History:  Diagnosis Date  . Headache     Medications: Outpatient Encounter Medications as of 08/08/2019  Medication Sig  . cyclobenzaprine (FLEXERIL) 10 MG tablet Take 1 tablet (10 mg total) by mouth 2 (two) times daily as needed for muscle spasms.  . metroNIDAZOLE (FLAGYL) 500 MG tablet Take 1 tablet (500 mg total) by mouth 2 (two) times daily.  . predniSONE (DELTASONE) 20 MG tablet Take 2 tablets (40 mg total) by mouth daily.  . rizatriptan (MAXALT-MLT) 10 MG disintegrating tablet Take 1 tablet (10 mg total) by mouth as needed for migraine. May repeat in 2 hours if needed  . topiramate (TOPAMAX) 50 MG tablet Take one tablet at bedtime for 7 days then 2 tablets at bedtime (Patient taking differently: Take 100 mg by mouth at bedtime. )   No facility-administered encounter medications on file as of 08/08/2019.     Allergies: Allergies  Allergen Reactions  . Other Other (  See Comments)    Narcotics pain medications don't work  for patient   . Morphine And Related Rash    Breakouts     Family History: No family history on file.  Social History: Social History   Socioeconomic History  . Marital status: Single    Spouse name: Not on file  . Number of children: Not on file  . Years of education: Not on file  . Highest education level: Not on file  Occupational History  . Not on file  Social Needs  . Financial resource strain: Not on file  . Food insecurity    Worry: Not on file    Inability: Not on file  . Transportation needs    Medical: Not on file    Non-medical: Not on file  Tobacco Use  . Smoking status: Current Some Day Smoker    Packs/day: 0.25    Types: Cigarettes  . Smokeless tobacco: Never Used  Substance and Sexual Activity  . Alcohol use: Yes    Comment: occasional  . Drug use: No  . Sexual activity: Not on file  Lifestyle  . Physical activity    Days per week: Not on file    Minutes per session: Not on file  . Stress: Not on file  Relationships  . Social Musicianconnections    Talks on phone: Not on file    Gets together: Not on file    Attends religious service: Not on file    Active member of club or organization: Not on file    Attends meetings of clubs or organizations: Not on file    Relationship status: Not on file  . Intimate partner violence    Fear of current or ex partner: Not on file    Emotionally abused: Not on file    Physically abused: Not on file    Forced sexual activity: Not on file  Other Topics Concern  . Not on file  Social History Narrative  . Not on file    Observations/Objective:   *** No acute distress.  Alert and oriented.  Speech fluent and not dysarthric.  Language intact.  Eyes orthophoric on primary gaze.  Face symmetric.  Assessment and Plan:   Migraine without aura, without status migrainosus, not intractable  Follow Up Instructions:    -I discussed the assessment and treatment plan with the patient. The patient was provided an  opportunity to ask questions and all were answered. The patient agreed with the plan and demonstrated an understanding of the instructions.   The patient was advised to call back or seek an in-person evaluation if the symptoms worsen or if the condition fails to improve as anticipated.    Total Time spent in visit with the patient was:  ***, of which more than 50% of the time was spent in counseling and/or coordinating care on ***.   Pt understands and agrees with the plan of care outlined.     Cira ServantAdam Robert , DO

## 2019-08-08 ENCOUNTER — Telehealth: Payer: BC Managed Care – PPO | Admitting: Neurology

## 2019-08-08 ENCOUNTER — Other Ambulatory Visit: Payer: Self-pay

## 2019-08-08 ENCOUNTER — Encounter

## 2019-08-09 ENCOUNTER — Ambulatory Visit: Payer: Self-pay | Admitting: Neurology

## 2019-08-09 ENCOUNTER — Other Ambulatory Visit: Payer: Self-pay | Admitting: Family Medicine

## 2019-08-09 DIAGNOSIS — R1084 Generalized abdominal pain: Secondary | ICD-10-CM

## 2019-08-11 NOTE — Progress Notes (Signed)
Virtual Visit via Video Note The purpose of this virtual visit is to provide medical care while limiting exposure to the novel coronavirus.    Consent was obtained for video visit:  Yes Answered questions that patient had about telehealth interaction:  Yes I discussed the limitations, risks, security and privacy concerns of performing an evaluation and management service by telemedicine. I also discussed with the patient that there may be a patient responsible charge related to this service. The patient expressed understanding and agreed to proceed.  Pt location: Home Physician Location: Home Name of referring provider:  Fleet Contras, MD I connected with Christy Leblanc at patients initiation/request on 08/12/2019 at  1:30 PM EDT by video enabled telemedicine application and verified that I am speaking with the correct person using two identifiers. Pt MRN:  [id number] Pt DOB:  [birth year] Video Participants:  Christy Leblanc   History of Present Illness:  Christy Leblanc is a 48 year old right-handed woman who is a current smoker who follows up for migraines.  UPDATE: I have not seen the patient since January 2018.  At that time, I restarted her on topiramate and Maxalt.   She did not keep follow up appointment and she subsequently ran out of topiramate.     In February, she started getting severe left greater than right stabbing eye pain.  From February to May, they lasted 5 minutes.  Then since June, they started lasting 5 seconds but several in a row. There may have been slightly more conjunctival injection but no ptosis.  They were occurring daily but only 1 or 2 last month.    She is now mostly having her typical headaches, starting from mild and progressing to severe.  She wakes up in the morning and lasts all day.  They may occur 5 days a week but she may have a week without headaches.  Current abortive medication:Tylenol Current muscle relaxer:  Flexeril 10mg  twice  daily PRN Antihypertensive medications:HCTZ Antidepressant medications: none Anticonvulsant medications: no Vitamins/Herbal/Supplements: none Other therapy: none  Caffeine: Coffee daily Alcohol: no Smoker: Current smoker Diet: Only eats dinner. Drinks water Exercise: Not routine Depression/stress: Family stress Sleep hygiene: poor  HISTORY: Onset:  Since 48 years old old following a head injury with mild concussion. Cans in a grocery store fell on her head. Location: Starts bilaterally in back of neck and travels up to the front. Sometimes just right-sided or left-sided Quality: Throbbing, pressure Initial intensity: 9-10/10; September 2016: 9-10/10 Aura: no Prodrome: no Associated symptoms: Head feels tingling, photophobia, phonophobia, phonosensitivity in right ear. Blurred vision in either eye.  No nausea Initial Duration: All day; September 2016: Less than 2 hours with Maxalt Initial Frequency: every other day; September 2016: 15 headache days per month (10 days severe) Triggers/exacerbating factors: Smells of cleaning fluid and noise from residence trigger it. Relieving factors: Keeping still Activity: Functions but rather would lay down  Past abortive therapy: sumatriptan 50mg , Tylenol, meloxicam, diclofenac, Vicodin, naproxen, Maxalt MLT 10mg  (effective), Excedrin Past preventative therapy: topiramate 100mg  (effective)  CT of head from 04/15/15 was normal.   Family history of headache: Mother. No family history of aneurysms.  Past Medical History: Past Medical History:  Diagnosis Date  . Headache     Medications: Outpatient Encounter Medications as of 08/12/2019  Medication Sig  . cyclobenzaprine (FLEXERIL) 10 MG tablet Take 1 tablet (10 mg total) by mouth 2 (two) times daily as needed for muscle spasms.  . metroNIDAZOLE (FLAGYL) 500 MG tablet  Take 1 tablet (500 mg total) by mouth 2 (two) times daily.  . predniSONE (DELTASONE) 20  MG tablet Take 2 tablets (40 mg total) by mouth daily.  . rizatriptan (MAXALT-MLT) 10 MG disintegrating tablet Take 1 tablet (10 mg total) by mouth as needed for migraine. May repeat in 2 hours if needed  . topiramate (TOPAMAX) 50 MG tablet Take one tablet at bedtime for 7 days then 2 tablets at bedtime (Patient taking differently: Take 100 mg by mouth at bedtime. )   No facility-administered encounter medications on file as of 08/12/2019.     Allergies: Allergies  Allergen Reactions  . Other Other (See Comments)    Narcotics pain medications don't work for patient   . Morphine And Related Rash    Breakouts     Family History: No family history on file.  Social History: Social History   Socioeconomic History  . Marital status: Single    Spouse name: Not on file  . Number of children: Not on file  . Years of education: Not on file  . Highest education level: Not on file  Occupational History  . Not on file  Social Needs  . Financial resource strain: Not on file  . Food insecurity    Worry: Not on file    Inability: Not on file  . Transportation needs    Medical: Not on file    Non-medical: Not on file  Tobacco Use  . Smoking status: Current Some Day Smoker    Packs/day: 0.25    Types: Cigarettes  . Smokeless tobacco: Never Used  Substance and Sexual Activity  . Alcohol use: Yes    Comment: occasional  . Drug use: No  . Sexual activity: Not on file  Lifestyle  . Physical activity    Days per week: Not on file    Minutes per session: Not on file  . Stress: Not on file  Relationships  . Social Herbalist on phone: Not on file    Gets together: Not on file    Attends religious service: Not on file    Active member of club or organization: Not on file    Attends meetings of clubs or organizations: Not on file    Relationship status: Not on file  . Intimate partner violence    Fear of current or ex partner: Not on file    Emotionally abused: Not on  file    Physically abused: Not on file    Forced sexual activity: Not on file  Other Topics Concern  . Not on file  Social History Narrative  . Not on file    Observations/Objective:   Height 5\' 3"  (1.6 m), weight 170 lb (77.1 kg). No acute distress.  Alert and oriented.  Speech fluent and not dysarthric.  Language intact.  Eyes orthophoric on primary gaze.  Face symmetric.  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  For preventative management, restart topiramate 25mg  at bedtime for 1 week, then 50mg  at bedtime for 1 week, then 100mg  at bedtime. 2.  For abortive therapy, restart Maxalt 10mg  3.  Limit use of pain relievers to no more than 2 days out of week to prevent risk of rebound or medication-overuse headache. 4.  Keep headache diary 5.  Exercise, hydration, caffeine cessation, sleep hygiene, monitor for and avoid triggers 6.  Consider:  magnesium citrate 400mg  daily, riboflavin 400mg  daily,  and coenzyme Q10 100mg  three times daily 7. Follow up 4 months   Follow Up Instructions:    -I discussed the assessment and treatment plan with the patient. The patient was provided an opportunity to ask questions and all were answered. The patient agreed with the plan and demonstrated an understanding of the instructions.   The patient was advised to call back or seek an in-person evaluation if the symptoms worsen or if the condition fails to improve as anticipated.   Cira ServantAdam Robert Ruqaya Strauss, DO

## 2019-08-12 ENCOUNTER — Encounter: Payer: Self-pay | Admitting: Neurology

## 2019-08-12 ENCOUNTER — Telehealth (INDEPENDENT_AMBULATORY_CARE_PROVIDER_SITE_OTHER): Payer: BC Managed Care – PPO | Admitting: Neurology

## 2019-08-12 ENCOUNTER — Other Ambulatory Visit: Payer: Self-pay

## 2019-08-12 DIAGNOSIS — G43009 Migraine without aura, not intractable, without status migrainosus: Secondary | ICD-10-CM | POA: Diagnosis not present

## 2019-08-12 MED ORDER — RIZATRIPTAN BENZOATE 10 MG PO TBDP: 10.0000 mg | ORAL_TABLET | ORAL | 3 refills | Status: AC | PRN

## 2019-08-12 MED ORDER — TOPIRAMATE 50 MG PO TABS: ORAL_TABLET | ORAL | 0 refills | Status: DC

## 2019-08-15 ENCOUNTER — Ambulatory Visit
Admission: RE | Admit: 2019-08-15 | Discharge: 2019-08-15 | Disposition: A | Discharge status: Home / Self Care | Payer: BC Managed Care – PPO | Source: Ambulatory Visit | Attending: Family Medicine | Admitting: Family Medicine

## 2019-08-15 DIAGNOSIS — R1084 Generalized abdominal pain: Secondary | ICD-10-CM

## 2019-08-30 ENCOUNTER — Telehealth: Payer: Self-pay | Admitting: Neurology

## 2019-08-30 NOTE — Telephone Encounter (Signed)
Some of the symptoms such as appetite suppression and paresthesias may be due to the topiramate.  However, she did well with it in the past.  I advise that she decrease the dose back to 50mg  at bedtime.  In 4 weeks, she should contact us with an update and we can continue increasing the dose.

## 2019-08-30 NOTE — Telephone Encounter (Signed)
Called patient and relayed all Md's info below. Verbalized understanding. She will start taking 50mg  at bedtime and call us in a month for an update.

## 2019-08-30 NOTE — Telephone Encounter (Signed)
Patient left msg with after hours about the topiramate 50mg  -2 pills she is having side effects; new tingling in legs, feet and hand- no appetite- seeing double- hard of hearing at times- coordination is off- stuffy nose, sneezing and sore throat. The nurse on call instructed her to go to the ED. Thanks!

## 2019-08-30 NOTE — Telephone Encounter (Addendum)
On call nurse called when office opened and connected patient to me. Patient was seen 9/14 with Dr. Tomi Likens for migraines when she was to restart topiramate 25mg  at bedtime for a week then 50mg  at bedtime for a week then 100mg  at bedtime for a week. She has been on the 100mg  dose all week.  Noted On Call nurse charted in her notes told patient to go to ER. When I talked to patient her information included several things that happened once. Only thing that seems to be consistent is the decreased appetite.  Said for about a week she has had tingling in her left leg (behind her knee) and in her foot. In talking about this patient said to tell me to take that off the list - she isn't having any more issues with this.  She saw double a week ago (last Friday) but has not had since then.  She has no appetite. Hard of hearing this week when son was talking to her - just once couldn't understand son last night. Asked about coordination that she mentioned to on call nurse - patient said her mom said it was maybe because dealing with stress (work/college). Patient agreed she has a lot going on.  Feels like cold (sore throat/stuffy nose/sneezing) is getting better now drank juice today.  Has taken Maxalt once for migraine this week - helped the headache though she did feel a little dizzy after she took this med.   Stopped BP med recently (HCTZ) at the same time she started topiramate. Her PCP is Dr. Rachell Cipro and she discussed stopping her BP med with her  [facility] Dr. Willis Modena ordered her overactive bladder medicine for one month and is done with that.   States the only meds she is taking (other than prescribed by Dr. Tomi Likens) is Prilosec 40mg  daily for her stomach.

## 2019-09-01 ENCOUNTER — Emergency Department (HOSPITAL_COMMUNITY): Payer: BC Managed Care – PPO

## 2019-09-01 ENCOUNTER — Emergency Department (HOSPITAL_COMMUNITY)
Admission: EM | Admit: 2019-09-01 | Discharge: 2019-09-02 | Disposition: A | Discharge status: Home / Self Care | Payer: BC Managed Care – PPO | Attending: Emergency Medicine | Admitting: Emergency Medicine

## 2019-09-01 ENCOUNTER — Other Ambulatory Visit: Payer: Self-pay

## 2019-09-01 ENCOUNTER — Encounter (HOSPITAL_COMMUNITY): Payer: Self-pay | Admitting: Emergency Medicine

## 2019-09-01 DIAGNOSIS — R0789 Other chest pain: Secondary | ICD-10-CM | POA: Diagnosis not present

## 2019-09-01 DIAGNOSIS — I10 Essential (primary) hypertension: Secondary | ICD-10-CM | POA: Insufficient documentation

## 2019-09-01 MED ORDER — SODIUM CHLORIDE 0.9% FLUSH: 3.0000 mL | Freq: Once | INTRAVENOUS | Status: DC

## 2019-09-01 NOTE — ED Triage Notes (Signed)
C/o pressure to center of chest and heart racing around noon today.  Reports BP 150/110 and HR 137.  She took BP medication and Children's motrin and states she felt a little better for awhile and then symptoms returned.  Mild SOB.  Denies nausea and vomiting.

## 2019-09-02 LAB — CBC
HCT: 46.6 % — ABNORMAL HIGH (ref 36.0–46.0)
Hemoglobin: 16.2 g/dL — ABNORMAL HIGH (ref 12.0–15.0)
MCH: 32.1 pg (ref 26.0–34.0)
MCHC: 34.8 g/dL (ref 30.0–36.0)
MCV: 92.5 fL (ref 80.0–100.0)
Platelets: 315 10*3/uL (ref 150–400)
RBC: 5.04 MIL/uL (ref 3.87–5.11)
RDW: 12.3 % (ref 11.5–15.5)
WBC: 8.5 10*3/uL (ref 4.0–10.5)
nRBC: 0 % (ref 0.0–0.2)

## 2019-09-02 LAB — BASIC METABOLIC PANEL
Anion gap: 11 (ref 5–15)
BUN: 8 mg/dL (ref 6–20)
CO2: 21 mmol/L — ABNORMAL LOW (ref 22–32)
Calcium: 9.5 mg/dL (ref 8.9–10.3)
Chloride: 101 mmol/L (ref 98–111)
Creatinine, Ser: 0.89 mg/dL (ref 0.44–1.00)
GFR calc Af Amer: >60 mL/min (ref >60)
GFR calc non Af Amer: >60 mL/min (ref >60)
Glucose, Bld: 99 mg/dL (ref 70–99)
Potassium: 3.2 mmol/L — ABNORMAL LOW (ref 3.5–5.1)
Sodium: 133 mmol/L — ABNORMAL LOW (ref 135–145)

## 2019-09-02 LAB — I-STAT BETA HCG BLOOD, ED (MC, WL, AP ONLY): I-stat hCG, quantitative: <5 m[IU]/mL (ref <5)

## 2019-09-02 LAB — TROPONIN I (HIGH SENSITIVITY)
Troponin I (High Sensitivity): 6 ng/L (ref <18)
Troponin I (High Sensitivity): 4 ng/L (ref <18)

## 2019-09-02 NOTE — ED Provider Notes (Signed)
[facility] EMERGENCY DEPARTMENT Provider Note   CSN: [id number] Arrival date & time: 09/01/19  2314     History   Chief Complaint Chief Complaint  Patient presents with  . Chest Pain  . Hypertension    HPI Christy Leblanc is a 48 y.o. female.     Patient is a 48 year old female with history of hypertension and migraines.  She presents today for evaluation of palpitations and elevated blood pressure.  Patient states that she felt discomfort to the lower part of her chest at around noon today.  She checked her blood pressures several times throughout the day and they were to the 675F systolic.  She also obtained readings that her heart rate was in the 130s.  She presents this evening for evaluation over concern of these vital signs.  Patient is now feeling better.  She denies any chest pain or difficulty breathing.  She denies any palpitations.  The history is provided by the patient.  Chest Pain Pain location:  Substernal area Pain quality: tightness   Pain radiates to:  Does not radiate Pain severity:  Moderate Onset quality:  Sudden Timing:  Constant Progression:  Resolved Chronicity:  New Hypertension Associated symptoms include chest pain.    Past Medical History:  Diagnosis Date  . Headache   . Hypertension     Patient Active Problem List   Diagnosis Date Noted  . Chronic migraine without aura without status migrainosus, not intractable 05/08/2015  . Tobacco abuse 05/08/2015    Past Surgical History:  Procedure Laterality Date  . CESAREAN SECTION    . FOOT SURGERY     Right foot surgery  . TUBAL LIGATION       OB History   No obstetric history on file.      Home Medications    Prior to Admission medications   Medication Sig Start Date End Date Taking? Authorizing Provider  hydrochlorothiazide (HYDRODIURIL) 12.5 MG tablet Take 12.5 mg by mouth daily.  07/26/19  Yes [provider]  omeprazole (PRILOSEC) 40 MG  capsule Take 40 mg by mouth daily.    Yes [provider]  oxybutynin (DITROPAN XL) 5 MG 24 hr tablet Take 5 mg by mouth daily.    Yes [provider]  rizatriptan (MAXALT-MLT) 10 MG disintegrating tablet Take 1 tablet (10 mg total) by mouth as needed for migraine. May repeat in 2 hours if needed.  Maximum 2 tablets in 24 hours 08/12/19  Yes Jaffe, Adam R, DO  topiramate (TOPAMAX) 50 MG tablet Take 1/2 tablet at bedtime for one week, then 1 tablet at bedtime for one week, then 2 tablets at bedtime. Patient taking differently: Take 50 mg by mouth at bedtime.  08/12/19  Yes Jaffe, Adam R, DO  cyclobenzaprine (FLEXERIL) 10 MG tablet Take 1 tablet (10 mg total) by mouth 2 (two) times daily as needed for muscle spasms. Patient not taking: Reported on 09/02/2019 07/20/17   Jeannett Senior, PA-C    Family History Family History  Problem Relation Age of Onset  . Thyroid disease Mother   . Hypertension Mother   . Stroke Father     Social History Social History   Tobacco Use  . Smoking status: Current Some Day Smoker    Packs/day: 0.25    Types: Cigarettes  . Smokeless tobacco: Never Used  Substance Use Topics  . Alcohol use: Yes    Comment: occasional  . Drug use: No     Allergies  Other and Morphine and related   Review of Systems Review of Systems  Cardiovascular: Positive for chest pain.  All other systems reviewed and are negative.    Physical Exam Updated Vital Signs BP (!) 112/98 (BP Location: Right Arm)   Pulse 94   Temp 98.1 F (36.7 C) (Oral)   Resp 15   SpO2 100%   Physical Exam Vitals signs and nursing note reviewed.  Constitutional:      General: She is not in acute distress.    Appearance: She is well-developed. She is not diaphoretic.  HENT:     Head: Normocephalic and atraumatic.  Neck:     Musculoskeletal: Normal range of motion and neck supple.  Cardiovascular:     Rate and Rhythm: Normal rate and regular rhythm.     Heart  sounds: No murmur. No friction rub. No gallop.   Pulmonary:     Effort: Pulmonary effort is normal. No respiratory distress.     Breath sounds: Normal breath sounds. No wheezing.  Abdominal:     General: Bowel sounds are normal. There is no distension.     Palpations: Abdomen is soft.     Tenderness: There is no abdominal tenderness.  Musculoskeletal: Normal range of motion.     Right lower leg: She exhibits no tenderness. No edema.     Left lower leg: She exhibits no tenderness. No edema.  Skin:    General: Skin is warm and dry.  Neurological:     Mental Status: She is alert and oriented to person, place, and time.      ED Treatments / Results  Labs (all labs ordered are listed, but only abnormal results are displayed) Labs Reviewed  BASIC METABOLIC PANEL - Abnormal; Notable for the following components:      Result Value   Sodium 133 (*)    Potassium 3.2 (*)    CO2 21 (*)    All other components within normal limits  CBC - Abnormal; Notable for the following components:   Hemoglobin 16.2 (*)    HCT 46.6 (*)    All other components within normal limits  I-STAT BETA HCG BLOOD, ED ([facility], [facility], [facility] ONLY)  TROPONIN I (HIGH SENSITIVITY)  TROPONIN I (HIGH SENSITIVITY)    EKG EKG Interpretation  Date/Time:  Sunday September 01 2019 23:47:08 EDT Ventricular Rate:  115 PR Interval:  154 QRS Duration: 70 QT Interval:  314 QTC Calculation: 434 R Axis:   76 Text Interpretation:  Sinus tachycardia Minimal voltage criteria for LVH, may be normal variant ( Sokolow-Lyon ) ST & T wave abnormality, consider inferior ischemia Abnormal ECG No specific change from 09/12/2014 Confirmed by Geoffery LyonseLo, Trek Kimball ([id number]) on 09/02/2019 2:08:19 AM   Radiology Dg Chest 2 View  Result Date: 09/01/2019 CLINICAL DATA:  Chest pain EXAM: CHEST - 2 VIEW COMPARISON:  09/12/2014 FINDINGS: The heart size and mediastinal contours are within normal limits. Both lungs are clear. The visualized skeletal structures are  unremarkable. IMPRESSION: No active cardiopulmonary disease. Electronically Signed   By: Jasmine PangKim  Fujinaga M.D.   On: 09/01/2019 23:59    Procedures Procedures (including critical care time)  Medications Ordered in ED Medications  sodium chloride flush (NS) 0.9 % injection 3 mL (has no administration in time range)     Initial Impression / Assessment and Plan / ED Course  I have reviewed the triage vital signs and the nursing notes.  Pertinent labs & imaging results that were available during my care of the  patient were reviewed by me and considered in my medical decision making (see chart for details).  Patient presenting with palpitations and elevated blood pressure as described in the HPI.  She is also been experiencing some chest discomfort.  Her work-up today reveals a sinus rhythm with nonspecific T wave abnormalities, unchanged from prior studies as well as negative troponin x2.  Patient's blood pressure has not been significantly elevated while here in the ER.  She has maintained a sinus rhythm with no significant dysrhythmia.  At this point, I feel as though discharge is appropriate.  She is to follow-up as needed.  Final Clinical Impressions(s) / ED Diagnoses   Final diagnoses:  None    ED Discharge Orders    None       Geoffery Lyons, MD 09/02/19 [phone]

## 2019-09-02 NOTE — Discharge Instructions (Addendum)
Continue medications as previously prescribed.  Keep a record of your blood pressure over the next several days, then take this with you to your next doctor's appointment for review.  Return to the emergency department if you develop severe chest pain, difficulty breathing, severe headache, or other new and concerning symptoms.

## 2019-09-02 NOTE — ED Notes (Signed)
Patient verbalizes understanding of discharge instructions. Opportunity for questioning and answers were provided. Armband removed by staff, pt discharged from ED. Ambulated out to lobby  

## 2019-09-05 ENCOUNTER — Encounter (HOSPITAL_COMMUNITY): Payer: Self-pay | Admitting: Emergency Medicine

## 2019-09-05 ENCOUNTER — Other Ambulatory Visit: Payer: Self-pay

## 2019-09-05 ENCOUNTER — Emergency Department (HOSPITAL_COMMUNITY)
Admission: EM | Admit: 2019-09-05 | Discharge: 2019-09-05 | Disposition: A | Discharge status: Home / Self Care | Payer: BC Managed Care – PPO | Attending: Emergency Medicine | Admitting: Emergency Medicine

## 2019-09-05 DIAGNOSIS — Z79899 Other long term (current) drug therapy: Secondary | ICD-10-CM | POA: Diagnosis not present

## 2019-09-05 DIAGNOSIS — I10 Essential (primary) hypertension: Secondary | ICD-10-CM | POA: Insufficient documentation

## 2019-09-05 DIAGNOSIS — M436 Torticollis: Secondary | ICD-10-CM

## 2019-09-05 DIAGNOSIS — F17210 Nicotine dependence, cigarettes, uncomplicated: Secondary | ICD-10-CM | POA: Diagnosis not present

## 2019-09-05 DIAGNOSIS — M542 Cervicalgia: Secondary | ICD-10-CM | POA: Diagnosis present

## 2019-09-05 LAB — GROUP A STREP BY PCR: Group A Strep by PCR: NOT DETECTED

## 2019-09-05 MED ORDER — KETOROLAC TROMETHAMINE 60 MG/2ML IM SOLN
30.0000 mg | Freq: Once | INTRAMUSCULAR | Status: AC
  Administered 2019-09-05: 30 mg via INTRAMUSCULAR
  Filled 2019-09-05: qty 2

## 2019-09-05 MED ORDER — CYCLOBENZAPRINE HCL 10 MG PO TABS
5.0000 mg | ORAL_TABLET | Freq: Once | ORAL | Status: AC
  Administered 2019-09-05: 5 mg via ORAL
  Filled 2019-09-05: qty 1

## 2019-09-05 MED ORDER — CYCLOBENZAPRINE HCL 10 MG PO TABS: 10.0000 mg | ORAL_TABLET | Freq: Every day | ORAL | 0 refills | Status: AC

## 2019-09-05 NOTE — ED Provider Notes (Signed)
[facility] Provider Note  CSN: [id number] Arrival date & time: 09/05/19 [id number]  Chief Complaint(s) Neck Pain  HPI Christy AquasGloria Michelle Leblanc is a 48 y.o. female with a history of chronic migraines here with several days of gradually worsening neck pain.  Patient reports that this feels like a muscle cramp.  It is bilateral occipital.  Worse with movement and palpation of the posterior neck muscles.  Pain also worse with swallowing.  She denies any overt throat pain.  No fevers or chills.  No no sick contacts.  No coughing or congestion.  No chest pain or shortness of breath.  No focal deficits.  No visual disturbance.  She does endorse a tingling sensation in her occiput.  The history is provided by the patient.    Past Medical History Past Medical History:  Diagnosis Date  . Headache   . Hypertension    Patient Active Problem List   Diagnosis Date Noted  . Chronic migraine without aura without status migrainosus, not intractable 05/08/2015  . Tobacco abuse 05/08/2015   Home Medication(s) Prior to Admission medications   Medication Sig Start Date End Date Taking? Authorizing Provider  cyclobenzaprine (FLEXERIL) 10 MG tablet Take 1 tablet (10 mg total) by mouth at bedtime for 10 days. 09/05/19 09/15/19  Nira Connardama, Roya Gieselman Eduardo, MD  hydrochlorothiazide (HYDRODIURIL) 12.5 MG tablet Take 12.5 mg by mouth daily.  07/26/19   [provider]  omeprazole (PRILOSEC) 40 MG capsule Take 40 mg by mouth daily.     [provider]  oxybutynin (DITROPAN XL) 5 MG 24 hr tablet Take 5 mg by mouth daily.     [provider]  rizatriptan (MAXALT-MLT) 10 MG disintegrating tablet Take 1 tablet (10 mg total) by mouth as needed for migraine. May repeat in 2 hours if needed.  Maximum 2 tablets in 24 hours 08/12/19   Drema DallasJaffe, Adam R, DO  topiramate (TOPAMAX) 50 MG tablet Take 1/2 tablet at bedtime for one week, then 1 tablet at bedtime for one week, then  2 tablets at bedtime. Patient taking differently: Take 50 mg by mouth at bedtime.  08/12/19   Drema DallasJaffe, Adam R, DO                                                                                                                                    Past Surgical History Past Surgical History:  Procedure Laterality Date  . CESAREAN SECTION    . FOOT SURGERY     Right foot surgery  . TUBAL LIGATION     Family History Family History  Problem Relation Age of Onset  . Thyroid disease Mother   . Hypertension Mother   . Stroke Father     Social History Social History   Tobacco Use  . Smoking status: Current Some Day Smoker    Packs/day: 0.25    Types: Cigarettes  . Smokeless tobacco: Never  Used  Substance Use Topics  . Alcohol use: Yes    Comment: occasional  . Drug use: No   Allergies Other and Morphine and related  Review of Systems Review of Systems All other systems are reviewed and are negative for acute change except as noted in the HPI  Physical Exam Vital Signs  I have reviewed the triage vital signs BP 126/89 (BP Location: Left Arm)   Pulse 95   Temp 98.4 F (36.9 C) (Oral)   Resp 18   SpO2 97%   Physical Exam Vitals signs reviewed.  Constitutional:      General: She is not in acute distress.    Appearance: She is well-developed. She is not diaphoretic.  HENT:     Head: Normocephalic and atraumatic.     Right Ear: External ear normal.     Left Ear: External ear normal.     Nose: Nose normal.     Mouth/Throat:     Pharynx: Oropharynx is clear. No pharyngeal swelling, oropharyngeal exudate or posterior oropharyngeal erythema.  Eyes:     General: No scleral icterus.    Conjunctiva/sclera: Conjunctivae normal.  Neck:     Musculoskeletal: Normal range of motion. Muscular tenderness present. No spinous process tenderness.     Trachea: Phonation normal.   Cardiovascular:     Rate and Rhythm: Normal rate and regular rhythm.  Pulmonary:     Effort: Pulmonary  effort is normal. No respiratory distress.     Breath sounds: No stridor.  Abdominal:     General: There is no distension.  Musculoskeletal: Normal range of motion.  Lymphadenopathy:     Cervical: No cervical adenopathy.  Neurological:     Mental Status: She is alert and oriented to person, place, and time.  Psychiatric:        Behavior: Behavior normal.     ED Results and Treatments Labs (all labs ordered are listed, but only abnormal results are displayed) Labs Reviewed  GROUP A STREP BY PCR                                                                                                                         EKG  EKG Interpretation  Date/Time:    Ventricular Rate:    PR Interval:    QRS Duration:   QT Interval:    QTC Calculation:   R Axis:     Text Interpretation:        Radiology No results found.  Pertinent labs & imaging results that were available during my care of the patient were reviewed by me and considered in my medical decision making (see chart for details).  Medications Ordered in ED Medications  ketorolac (TORADOL) injection 30 mg (has no administration in time range)  cyclobenzaprine (FLEXERIL) tablet 5 mg (has no administration in time range)  Procedures Procedures  (including critical care time)  Medical Decision Making / ED Course I have reviewed the nursing notes for this encounter and the patient's prior records (if available in EHR or on provided paperwork).   [name] was evaluated in Emergency Department on 09/05/2019 for the symptoms described in the history of present illness. She was evaluated in the context of the global COVID-19 pandemic, which necessitated consideration that the patient might be at risk for infection with the SARS-CoV-2 virus that causes COVID-19. Institutional protocols and  algorithms that pertain to the evaluation of patients at risk for COVID-19 are in a state of rapid change based on information released by regulatory bodies including the CDC and federal and state organizations. These policies and algorithms were followed during the patient's care in the ED.  Consistent with torticollis.  No recent head trauma. No fever. Doubt meningitis. Doubt intracranial bleed. Doubt IIH. No indication for imaging.   Strep obtained in triage negative and no signs concerning for pharyngitis, PTA, RPA.   toradol IM and flexeril PO given.  The patient appears reasonably screened and/or stabilized for discharge and I doubt any other medical condition or other [facility] requiring further screening, evaluation, or treatment in the ED at this time prior to discharge.  The patient is safe for discharge with strict return precautions.       Final Clinical Impression(s) / ED Diagnoses Final diagnoses:  Torticollis, acute     The patient appears reasonably screened and/or stabilized for discharge and I doubt any other medical condition or other [facility] requiring further screening, evaluation, or treatment in the ED at this time prior to discharge.  Disposition: Discharge  Condition: Good  I have discussed the results, Dx and Tx plan with the patient who expressed understanding and agree(s) with the plan. Discharge instructions discussed at great length. The patient was given strict return precautions who verbalized understanding of the instructions. No further questions at time of discharge.    ED Discharge Orders         Ordered    cyclobenzaprine (FLEXERIL) 10 MG tablet  Daily at bedtime     09/05/19 0600            Follow Up: Fleet Contras, MD [address] [place] [address] [phone]  Schedule an appointment as soon as possible for a visit  As needed     This chart was dictated using voice recognition software.  Despite best efforts to proofread,   errors can occur which can change the documentation meaning.   Nira Conn, MD 09/05/19 [phone]

## 2019-09-05 NOTE — ED Notes (Signed)
Pt here for sore throat. Pt is also complaining of neck pain that has given her a headache. Triage RN Claiborne Billings informed

## 2019-09-05 NOTE — ED Notes (Signed)
Neck and throat pain for one week  No known injury pain from one ear the other

## 2019-09-05 NOTE — ED Triage Notes (Signed)
C/o sore throat since Monday and tingling to back R side of head.  States she can swallow hot liquids easier than cold and L side of throat is most painful.

## 2019-09-05 NOTE — Discharge Instructions (Signed)
You may use over-the-counter Motrin (Ibuprofen), Acetaminophen (Tylenol), topical muscle creams such as SalonPas, Icy Hot, Bengay, etc. Please stretch, apply heat, and have massage therapy for additional assistance. ° °

## 2019-09-07 ENCOUNTER — Other Ambulatory Visit: Payer: Self-pay

## 2019-09-07 ENCOUNTER — Emergency Department (HOSPITAL_COMMUNITY)
Admission: EM | Admit: 2019-09-07 | Discharge: 2019-09-07 | Disposition: A | Discharge status: Home / Self Care | Payer: BC Managed Care – PPO | Attending: Emergency Medicine | Admitting: Emergency Medicine

## 2019-09-07 ENCOUNTER — Encounter (HOSPITAL_COMMUNITY): Payer: Self-pay | Admitting: Obstetrics and Gynecology

## 2019-09-07 DIAGNOSIS — R519 Headache, unspecified: Secondary | ICD-10-CM | POA: Insufficient documentation

## 2019-09-07 DIAGNOSIS — Z79899 Other long term (current) drug therapy: Secondary | ICD-10-CM | POA: Insufficient documentation

## 2019-09-07 DIAGNOSIS — M542 Cervicalgia: Secondary | ICD-10-CM | POA: Diagnosis not present

## 2019-09-07 DIAGNOSIS — F17210 Nicotine dependence, cigarettes, uncomplicated: Secondary | ICD-10-CM | POA: Diagnosis not present

## 2019-09-07 DIAGNOSIS — R202 Paresthesia of skin: Secondary | ICD-10-CM | POA: Insufficient documentation

## 2019-09-07 DIAGNOSIS — I10 Essential (primary) hypertension: Secondary | ICD-10-CM | POA: Diagnosis not present

## 2019-09-07 MED ORDER — DEXAMETHASONE SODIUM PHOSPHATE 10 MG/ML IJ SOLN
10.0000 mg | Freq: Once | INTRAMUSCULAR | Status: AC
  Administered 2019-09-07: 10 mg via INTRAMUSCULAR
  Filled 2019-09-07: qty 1

## 2019-09-07 MED ORDER — PREDNISONE 10 MG (21) PO TBPK: ORAL_TABLET | Freq: Every day | ORAL | 0 refills | Status: DC

## 2019-09-07 MED ORDER — OXYCODONE HCL 5 MG/5ML PO SOLN
5.0000 mg | Freq: Once | ORAL | Status: AC
  Administered 2019-09-07: 5 mg via ORAL
  Filled 2019-09-07: qty 5

## 2019-09-07 MED ORDER — OXYCODONE HCL 5 MG PO TABS: 5.0000 mg | ORAL_TABLET | Freq: Four times a day (QID) | ORAL | 0 refills | Status: AC | PRN

## 2019-09-07 MED ORDER — OXYCODONE-ACETAMINOPHEN 5-325 MG PO TABS
1.0000 | ORAL_TABLET | Freq: Once | ORAL | Status: DC
  Filled 2019-09-07: qty 1

## 2019-09-07 NOTE — ED Provider Notes (Signed)
Medical screening examination/treatment/procedure(s) were conducted as a shared visit with non-physician practitioner(s) and myself.  I personally evaluated the patient during the encounter.    48yF with atraumatic neck pain. Seems very consistent with muscular etiology. She reports pain with swallowing but this is likely from utilization of neck musculature. Pharynx looks fine. Handling secretions No trismus. Normal sounding voice. Tachycardia noted. From less PO intake? Pain? I doubt serious deep space neck infection or meningitis. Afebrile. Nontoxic. No photophobia. Nonfocal neuro exam. Will provide soft collar for comfort. Steroids. Continued PRN meds. Return precautions discussed. Suspect symptoms will improve over the next few days. Understands what to return to the ER for. Outpt FU otherwise.    Virgel Manifold, MD 09/07/19 [phone]

## 2019-09-07 NOTE — ED Notes (Signed)
Patient is refusing to attempt to swallow pain medication. Patient reports she "tried to swallow water this morning and almost choked" Patient currently refusing to swallow water at this time as she reports she feels like she will choke.

## 2019-09-07 NOTE — Discharge Instructions (Addendum)
Take prednisone as prescribed starting tomorrow.  Take oxycodone as needed for pain.  Follow-up with your primary care provider in 2 days for continued evaluation.  Return to the ED immediately for new or worsening symptoms or concerns, such as fevers, chills, chest pain, shortness of breath or any concerns at all.

## 2019-09-07 NOTE — ED Triage Notes (Signed)
Patient complaint of sore neck. Patient reports she cannot sleep laying down as she is "too stiff" Patient reports that she hit her head and then went to [place] and was told she did not need a scan.  Patient reports the pain and stiffness has gotten worse in the last 48 hours.  Patient alert and oriented a this time.

## 2019-09-07 NOTE — ED Provider Notes (Signed)
[facility]-EMERGENCY DEPT Provider Note   CSN: [id number] Arrival date & time: 09/07/19  1218     History   Chief Complaint Chief Complaint  Patient presents with  . Neck Pain    HPI Christy [facility] is a 48 y.o. female.     HPI  48 year old female, with a PMH of headaches, HTN, presents with neck pain.  Patient describes pain as a circumferential neck radiating to the ED frontal head.  She describes as a tingling feeling in the frontal head.  She notes pain when she ranges her neck.  She denies any fevers, chills, cough, sputum production.  She denies any nausea, vomiting, abdominal pain.  She is was seen and evaluated for this pain on 10/8.  She states she was given Flexeril which has not been helping her symptoms.  Denies any injury or trauma.  Disagree with nursing notes.  Patient denies hitting her head, LOC.  She denies any neck injury or trauma.  Past Medical History:  Diagnosis Date  . Headache   . Hypertension     Patient Active Problem List   Diagnosis Date Noted  . Chronic migraine without aura without status migrainosus, not intractable 05/08/2015  . Tobacco abuse 05/08/2015    Past Surgical History:  Procedure Laterality Date  . CESAREAN SECTION    . FOOT SURGERY     Right foot surgery  . TUBAL LIGATION       OB History   No obstetric history on file.      Home Medications    Prior to Admission medications   Medication Sig Start Date End Date Taking? Authorizing Provider  cyclobenzaprine (FLEXERIL) 10 MG tablet Take 1 tablet (10 mg total) by mouth at bedtime for 10 days. 09/05/19 09/15/19  Nira Conn, MD  hydrochlorothiazide (HYDRODIURIL) 12.5 MG tablet Take 12.5 mg by mouth daily.  07/26/19   [provider]  omeprazole (PRILOSEC) 40 MG capsule Take 40 mg by mouth daily.     [provider]  oxybutynin (DITROPAN XL) 5 MG 24 hr tablet Take 5 mg by mouth daily.     [provider]   rizatriptan (MAXALT-MLT) 10 MG disintegrating tablet Take 1 tablet (10 mg total) by mouth as needed for migraine. May repeat in 2 hours if needed.  Maximum 2 tablets in 24 hours 08/12/19   [name], DO  topiramate (TOPAMAX) 50 MG tablet Take 1/2 tablet at bedtime for one week, then 1 tablet at bedtime for one week, then 2 tablets at bedtime. Patient taking differently: Take 50 mg by mouth at bedtime.  08/12/19   [name], DO    Family History Family History  Problem Relation Age of Onset  . Thyroid disease Mother   . Hypertension Mother   . Stroke Father     Social History Social History   Tobacco Use  . Smoking status: Current Some Day Smoker    Packs/day: 0.25    Types: Cigarettes  . Smokeless tobacco: Never Used  Substance Use Topics  . Alcohol use: Yes    Comment: occasional  . Drug use: No     Allergies   Other and Morphine and related   Review of Systems Review of Systems  Constitutional: Negative for chills and fever.  HENT: Negative for rhinorrhea and sore throat.   Respiratory: Negative for shortness of breath.   Cardiovascular: Negative for chest pain.  Gastrointestinal: Negative for abdominal pain, nausea and vomiting.  Musculoskeletal: Positive for neck pain. Negative for arthralgias, back pain and gait problem.  Neurological: Negative for dizziness and numbness.     Physical Exam Updated Vital Signs BP (!) 137/98 (BP Location: Right Arm)   Pulse (!) 121   Temp 98.6 F (37 C) (Oral)   Resp 18   SpO2 98%   Physical Exam Vitals signs and nursing note reviewed.  Constitutional:      Appearance: She is well-developed.  HENT:     Head: Normocephalic and atraumatic.  Eyes:     Conjunctiva/sclera: Conjunctivae normal.  Neck:     Musculoskeletal: Neck supple.  Cardiovascular:     Rate and Rhythm: Normal rate and regular rhythm.     Heart sounds: Normal heart sounds. No murmur.  Pulmonary:     Effort: Pulmonary effort is normal. No  respiratory distress.     Breath sounds: Normal breath sounds. No wheezing or rales.  Abdominal:     General: Bowel sounds are normal. There is no distension.     Palpations: Abdomen is soft.     Tenderness: There is no abdominal tenderness.  Musculoskeletal: Normal range of motion.        General: No tenderness or deformity.  Skin:    General: Skin is warm and dry.     Findings: No erythema or rash.  Neurological:     Mental Status: She is alert and oriented to person, place, and time.     Cranial Nerves: Cranial nerves are intact.     Sensory: Sensation is intact.     Motor: Motor function is intact.     Coordination: Coordination is intact.  Psychiatric:        Behavior: Behavior normal.      ED Treatments / Results  Labs (all labs ordered are listed, but only abnormal results are displayed) Labs Reviewed - No data to display  EKG None  Radiology No results found.  Procedures Procedures (including critical care time)  Medications Ordered in ED Medications  oxyCODONE-acetaminophen (PERCOCET/ROXICET) 5-325 MG per tablet 1 tablet (1 tablet Oral Refused 09/07/19 1419)  oxyCODONE (ROXICODONE) 5 MG/5ML solution 5 mg (has no administration in time range)     Initial Impression / Assessment and Plan / ED Course  I have reviewed the triage vital signs and the nursing notes.  Pertinent labs & imaging results that were available during my care of the patient were reviewed by me and considered in my medical decision making (see chart for details).        Patient presents with neck pain for the last week.  She was seen and evaluated for this last Thursday and put on Flexeril.  She notes no improvement in her symptoms.  She denies any pain to the posterior pharynx.  She notes pain is of the circumferential neck.  She is tender palpation over the neck.  However there is no palpable deformity, no cervical lymphadenopathy.  Patient refuses to let the provider range of motion  her neck.  She denies any other symptoms such as fevers, chills, cough, sputum production, myalgias.  She has had no injury or trauma.  Her neuro exam is nonfocal, nonlateralizing.  No imaging indicated at this time.  She was given a soft collar.  She was offered pain medicine but states she cannot swallow.  However she is tolerating her secretions without difficulty and has been eating and drinking at home without problem.  She was given liquid pain medicine and drink without difficulty.  She was given a Decadron shot.  Will discharge with steroids, a few days of pain medicine.  Discussed with attending, Dr. Juleen ChinaKohut who saw and evaluated patient.  He is agreeable plan.  Patient ready and stable for discharge.   At this time there does not appear to be any evidence of an acute emergency medical condition and the patient appears stable for discharge with appropriate outpatient follow up.Diagnosis was discussed with patient who verbalizes understanding and is agreeable to discharge. Pt case discussed with Dr. Juleen ChinaKohut who agrees with my plan.   Final Clinical Impressions(s) / ED Diagnoses   Final diagnoses:  None    ED Discharge Orders    None       Rueben BashKendrick, Aadil Sur S, PA-C 09/07/19 1700    Raeford RazorKohut, Stephen, MD 09/07/19 1726

## 2019-09-14 ENCOUNTER — Other Ambulatory Visit: Payer: Self-pay | Admitting: Neurology

## 2019-12-11 ENCOUNTER — Encounter: Payer: Self-pay | Admitting: Neurology

## 2019-12-12 ENCOUNTER — Other Ambulatory Visit: Payer: Self-pay

## 2019-12-12 ENCOUNTER — Telehealth (INDEPENDENT_AMBULATORY_CARE_PROVIDER_SITE_OTHER): Payer: BC Managed Care – PPO | Admitting: Neurology

## 2019-12-12 ENCOUNTER — Encounter: Payer: Self-pay | Admitting: Neurology

## 2019-12-12 VITALS — Ht 63.0 in | Wt 180.0 lb

## 2019-12-12 DIAGNOSIS — G4486 Cervicogenic headache: Secondary | ICD-10-CM

## 2019-12-12 DIAGNOSIS — M542 Cervicalgia: Secondary | ICD-10-CM | POA: Diagnosis not present

## 2019-12-12 DIAGNOSIS — R519 Headache, unspecified: Secondary | ICD-10-CM

## 2019-12-12 DIAGNOSIS — G43009 Migraine without aura, not intractable, without status migrainosus: Secondary | ICD-10-CM

## 2019-12-12 MED ORDER — TIZANIDINE HCL 4 MG PO TABS: ORAL_TABLET | ORAL | 5 refills | Status: AC

## 2019-12-12 NOTE — Progress Notes (Signed)
Virtual Visit via Video Note The purpose of this virtual visit is to provide medical care while limiting exposure to the novel coronavirus.    Consent was obtained for video visit:  Yes.   Answered questions that patient had about telehealth interaction:  Yes.   I discussed the limitations, risks, security and privacy concerns of performing an evaluation and management service by telemedicine. I also discussed with the patient that there may be a patient responsible charge related to this service. The patient expressed understanding and agreed to proceed.  Pt location: Home Physician Location: office Name of referring provider:  Fleet Contras, MD I connected with Laddie Aquas at patients initiation/request on 12/12/2019 at 10:30 AM EST by video enabled telemedicine application and verified that I am speaking with the correct person using two identifiers. Pt MRN:  [id number] Pt DOB:  08-Dec-1970 Video Participants:  Laddie Aquas   History of Present Illness:  Christy Leblanc is a 49 year old right-handed woman who is a current smoker who follows up for migraines.  UPDATE: Started topiramate in September.  Side effects on 100mg  so dose was reduced back to 50mg  at bedtime.  Around that time, she developed neck pain with some tingling in back of head.  She went to the ED where she took a prednisone taper and wore a neck brace.  She still has some right-sided neck pain.  She has not had a migraine for over a month.  They typically respond well to rizatriptan (30 minutes).  However, she wakes up about every other day with a dull right occipital headache due to the neck discomfort.  It may last 1/2 the day.  Current analgesic:Tylenol Current triptan:  Rizatriptan 10mg  Current muscle relaxer:  none Antihypertensive medications:HCTZ Antidepressant medications:none Anticonvulsant medications:topiramate 50mg  at bedtime Vitamins/Herbal/Supplements:none Other  therapy:none  Caffeine:Coffee daily Alcohol:no Smoker:Current smoker Diet:Only eats dinner.Drinks water Exercise:Not routine Depression/stress:Family stress Sleep hygiene:poor  HISTORY: Migraine: Onset:  Since 49 years old old following a head injury with mild concussion.Cans in a grocery store fell on her head. Location:Starts bilaterally in back of neck and travels up to the front.Sometimes just right-sidedor left-sided Quality:Throbbing, pressure Initial intensity:9-10/10; September 2016: 9-10/10 Aura:no Prodrome:no Associated symptoms:Head feels tingling, photophobia, phonophobia, phonosensitivity in right ear.Blurred vision in either eye.No nausea Initial Duration:All day; September 2016: Less than 2 hours with Maxalt Initial Frequency:every other day; September 2016: 15 headache days per month (10 days severe) Triggers/exacerbating factors:Smells of cleaning fluid and noise from residence trigger it. Relieving factors:Keeping still Activity:Functions but rather would lay down  Primary stabbing headache: In February 2020, she started getting severe left greater than right stabbing eye pain.  From February to May 2020, they lasted 5 minutes.  Then since June, they started lasting 5 seconds but several in a row. There may have been slightly more conjunctival injection but no ptosis.  They were occurring daily but only 1 or 2 during early summer.  By late summer 2020, she started mostly having her typical headaches, starting from mild and progressing to severe.  She wakes up in the morning and lasts all day.  They may occur 5 days a week but she may have a week without headaches.  Past abortive therapy:sumatriptan 50mg , Tylenol, meloxicam, diclofenac, Vicodin, naproxen, Maxalt MLT 10mg  (effective), Excedrin Past preventative therapy:topiramate 100mg  (effective) Past muscle relaxants: Flexeril 10mg  twice daily PRN  CT of head  from 04/15/15 was normal.   Family history of headache:Mother.No family history of aneurysms.  Past  Medical History: Past Medical History:  Diagnosis Date  . Headache   . Hypertension     Medications: Outpatient Encounter Medications as of 12/12/2019  Medication Sig  . hydrochlorothiazide (HYDRODIURIL) 12.5 MG tablet Take 12.5 mg by mouth daily.   Marland Kitchen omeprazole (PRILOSEC) 40 MG capsule Take 40 mg by mouth daily.   Marland Kitchen oxybutynin (DITROPAN XL) 5 MG 24 hr tablet Take 5 mg by mouth daily.   Marland Kitchen oxyCODONE (ROXICODONE) 5 MG immediate release tablet Take 1 tablet (5 mg total) by mouth every 6 (six) hours as needed for severe pain.  . rizatriptan (MAXALT-MLT) 10 MG disintegrating tablet Take 1 tablet (10 mg total) by mouth as needed for migraine. May repeat in 2 hours if needed.  Maximum 2 tablets in 24 hours  . topiramate (TOPAMAX) 50 MG tablet Take 2 tablets (100 mg total) by mouth at bedtime. Take 100mg  at bedtime  . predniSONE (STERAPRED UNI-PAK 21 TAB) 10 MG (21) TBPK tablet Take by mouth daily. Take 6 tabs by mouth daily  for 2 days, then 5 tabs for 2 days, then 4 tabs for 2 days, then 3 tabs for 2 days, 2 tabs for 2 days, then 1 tab by mouth daily for 2 days   No facility-administered encounter medications on file as of 12/12/2019.    Allergies: Allergies  Allergen Reactions  . Other Other (See Comments)    Narcotics pain medications don't work for patient   . Morphine And Related Rash    Breakouts     Family History: Family History  Problem Relation Age of Onset  . Thyroid disease Mother   . Hypertension Mother   . Stroke Father     Social History: Social History   Socioeconomic History  . Marital status: Single    Spouse name: Not on file  . Number of children: 4  . Years of education: 73  . Highest education level: Not on file  Occupational History  . Occupation: [employer]  Tobacco Use  . Smoking status: Current Some Day Smoker    Packs/day: 0.25     Types: Cigarettes  . Smokeless tobacco: Never Used  Substance and Sexual Activity  . Alcohol use: Yes    Comment: occasional  . Drug use: No  . Sexual activity: Not on file  Other Topics Concern  . Not on file  Social History Narrative   Live in 2 level home with sons; R handed; college now; caffeine 1 cup coffee/day;    Social Determinants of Health   Financial Resource Strain:   . Difficulty of Paying Living Expenses: Not on file  Food Insecurity:   . Worried About Charity fundraiser in the Last Year: Not on file  . Ran Out of Food in the Last Year: Not on file  Transportation Needs:   . Lack of Transportation (Medical): Not on file  . Lack of Transportation (Non-Medical): Not on file  Physical Activity:   . Days of Exercise per Week: Not on file  . Minutes of Exercise per Session: Not on file  Stress:   . Feeling of Stress : Not on file  Social Connections:   . Frequency of Communication with Friends and Family: Not on file  . Frequency of Social Gatherings with Friends and Family: Not on file  . Attends Religious Services: Not on file  . Active Member of Clubs or Organizations: Not on file  . Attends Archivist Meetings: Not on file  .  Marital Status: Not on file  Intimate Partner Violence:   . Fear of Current or Ex-Partner: Not on file  . Emotionally Abused: Not on file  . Physically Abused: Not on file  . Sexually Abused: Not on file    Observations/Objective:   Height 5\' 3"  (1.6 m), weight 180 lb (81.6 kg). No acute distress.  Alert and oriented.  Speech fluent and not dysarthric.  Language intact.  Eyes orthophoric on primary gaze.  Face symmetric.  Assessment and Plan:   1.  Migraine without aura, without status migrainosus, not intractable 2.  Cervicogenic headache 3.  Cervicalgia  Defers physical therapy for now. 1.  Start tizanidine 2 to 4mg  at bedtime 2.  Continue topiramate 50mg  at bedtime 3.  Rizatriptan 10mg  as needed for acute  migraines 4.  Tylenol for neck pain. 5.  Recommend getting a non-contour memory foam pillow to keep neck in neutral position at night. 6.  Follow up in 4 months.  Follow Up Instructions:    -I discussed the assessment and treatment plan with the patient. The patient was provided an opportunity to ask questions and all were answered. The patient agreed with the plan and demonstrated an understanding of the instructions.   The patient was advised to call back or seek an in-person evaluation if the symptoms worsen or if the condition fails to improve as anticipated.   , DO

## 2020-02-15 ENCOUNTER — Other Ambulatory Visit: Payer: Self-pay | Admitting: Neurology

## 2020-05-07 NOTE — Progress Notes (Deleted)
NEUROLOGY FOLLOW UP OFFICE NOTE  Christy Leblanc [id number]  HISTORY OF PRESENT ILLNESS: Christy Leblanc is a 49 year old right-handed woman who is a current smoker who follows up for migraines.  UPDATE: Started topiramate in September.  Side effects on 100mg  so dose was reduced back to 50mg  at bedtime.  Current analgesic:Tylenol Current triptan:  Rizatriptan 10mg  Current muscle relaxer: tizanidine 2 to 4mg  at bedtime Antihypertensive medications:HCTZ Antidepressant medications:none Anticonvulsant medications:topiramate 50mg  at bedtime Vitamins/Herbal/Supplements:none Other therapy:none  Caffeine:Coffee daily Alcohol:no Smoker:Current smoker Diet:Only eats dinner.Drinks water Exercise:Not routine Depression/stress:Family stress Sleep hygiene:poor  HISTORY: Migraine: Onset: Since 49 years old old following a head injury with mild concussion.Cans in a grocery store fell on her head. Location:Starts bilaterally in back of neck and travels up to the front.Sometimes just right-sidedor left-sided Quality:Throbbing, pressure Initial intensity:9-10/10; September 2016: 9-10/10 Aura:no Prodrome:no Associated symptoms:Head feels tingling, photophobia, phonophobia, phonosensitivity in right ear.Blurred vision in either eye.No nausea Initial Duration:All day; September 2016: Less than 2 hours with Maxalt Initial Frequency:every other day; September 2016: 15 headache days per month (10 days severe) Triggers/exacerbating factors:Smells of cleaning fluid and noise from residence trigger it. Relieving factors:Keeping still Activity:Functions but rather would lay down  Primary stabbing headache: In February 2020, she started getting severe left greater than right stabbing eye pain. From February to May 2020, they lasted 5 minutes. Then since June, they started lasting 5 seconds but several in a row. There may have been  slightly more conjunctival injection but no ptosis. They were occurring daily but only 1 or 2 during early summer.  By late summer 2020, she started mostly having her typical headaches, starting from mild and progressing to severe. She wakes up in the morning and lasts all day. They may occur 5 days a week but she may have a week without headaches.  Past abortive therapy:sumatriptan 50mg , Tylenol, meloxicam, diclofenac, Vicodin, naproxen, Maxalt MLT 10mg  (effective), Excedrin Past preventative therapy:topiramate 100mg  (effective) Past muscle relaxants: Flexeril 10mg  twice daily PRN  CT of head from 04/15/15 was normal.  Cervicalgia: In September 2020, she developed neck pain with some tingling in back of head.  She went to the ED where she took a prednisone taper and wore a neck brace.  She still has some right-sided neck pain.  She has not had a migraine for over a month.  They typically respond well to rizatriptan (30 minutes).  She subsequently would wake up about every other day with a dull right occipital headache due to the neck discomfort.  It may last 1/2 the day.    Family history of headache:Mother.No family history of aneurysms. PAST MEDICAL HISTORY: Past Medical History:  Diagnosis Date  . Headache   . Hypertension     MEDICATIONS: Current Outpatient Medications on File Prior to Visit  Medication Sig Dispense Refill  . hydrochlorothiazide (HYDRODIURIL) 12.5 MG tablet Take 12.5 mg by mouth daily.     Marland Kitchen omeprazole (PRILOSEC) 40 MG capsule Take 40 mg by mouth daily.     Marland Kitchen oxybutynin (DITROPAN XL) 5 MG 24 hr tablet Take 5 mg by mouth daily.     Marland Kitchen oxyCODONE (ROXICODONE) 5 MG immediate release tablet Take 1 tablet (5 mg total) by mouth every 6 (six) hours as needed for severe pain. 10 tablet 0  . predniSONE (STERAPRED UNI-PAK 21 TAB) 10 MG (21) TBPK tablet Take by mouth daily. Take 6 tabs by mouth daily  for 2 days, then 5 tabs for 2 days, then 4  tabs for 2 days, then  3 tabs for 2 days, 2 tabs for 2 days, then 1 tab by mouth daily for 2 days 42 tablet 0  . rizatriptan (MAXALT-MLT) 10 MG disintegrating tablet Take 1 tablet (10 mg total) by mouth as needed for migraine. May repeat in 2 hours if needed.  Maximum 2 tablets in 24 hours 9 tablet 3  . tiZANidine (ZANAFLEX) 4 MG tablet Take 1/2 to 1 tablet at bedtime as needed. 30 tablet 5  . topiramate (TOPAMAX) 50 MG tablet TAKE 2 TABLETS(100 MG) BY MOUTH AT BEDTIME 60 tablet 3   No current facility-administered medications on file prior to visit.    ALLERGIES: Allergies  Allergen Reactions  . Other Other (See Comments)    Narcotics pain medications don't work for patient   . Morphine And Related Rash    Breakouts     FAMILY HISTORY: Family History  Problem Relation Age of Onset  . Thyroid disease Mother   . Hypertension Mother   . Stroke Father     SOCIAL HISTORY: Social History   Socioeconomic History  . Marital status: Single    Spouse name: Not on file  . Number of children: 4  . Years of education: 73  . Highest education level: Not on file  Occupational History  . Occupation: [employer] school  Tobacco Use  . Smoking status: Current Some Day Smoker    Packs/day: 0.25    Types: Cigarettes  . Smokeless tobacco: Never Used  Vaping Use  . Vaping Use: Never used  Substance and Sexual Activity  . Alcohol use: Yes    Comment: occasional  . Drug use: No  . Sexual activity: Not on file  Other Topics Concern  . Not on file  Social History Narrative   Live in 2 level home with sons; R handed; college now; caffeine 1 cup coffee/day;    Social Determinants of Health   Financial Resource Strain:   . Difficulty of Paying Living Expenses:   Food Insecurity:   . Worried About Programme researcher, broadcasting/film/video in the Last Year:   . Barista in the Last Year:   Transportation Needs:   . Freight forwarder (Medical):   Marland Kitchen Lack of Transportation (Non-Medical):   Physical Activity:   .  Days of Exercise per Week:   . Minutes of Exercise per Session:   Stress:   . Feeling of Stress :   Social Connections:   . Frequency of Communication with Friends and Family:   . Frequency of Social Gatherings with Friends and Family:   . Attends Religious Services:   . Active Member of Clubs or Organizations:   . Attends Banker Meetings:   Marland Kitchen Marital Status:   Intimate Partner Violence:   . Fear of Current or Ex-Partner:   . Emotionally Abused:   Marland Kitchen Physically Abused:   . Sexually Abused:     PHYSICAL EXAM: *** General: No acute distress.  Patient appears well-groomed.   Head:  Normocephalic/atraumatic Eyes:  Fundi examined but not visualized Neck: supple, no paraspinal tenderness, full range of motion Heart:  Regular rate and rhythm Lungs:  Clear to auscultation bilaterally Back: No paraspinal tenderness Neurological Exam: alert and oriented to person, place, and time. Attention span and concentration intact, recent and remote memory intact, fund of knowledge intact.  Speech fluent and not dysarthric, language intact.  CN II-XII intact. Bulk and tone normal, muscle strength 5/5 throughout.  Sensation  to light touch, temperature and vibration intact.  Deep tendon reflexes 2+ throughout, toes downgoing.  Finger to nose and heel to shin testing intact.  Gait normal, Romberg negative.  IMPRESSION: 1.  Migraine without aura, without status migrainosus, not intractable 2.  Cervicogenic headache 3.  Cervicalgia  PLAN: ***  Shon Millet, DO  CC: ***

## 2020-05-11 ENCOUNTER — Ambulatory Visit: Payer: BC Managed Care – PPO | Admitting: Neurology

## 2020-06-26 ENCOUNTER — Other Ambulatory Visit: Payer: Self-pay | Admitting: Neurology

## 2020-07-20 ENCOUNTER — Other Ambulatory Visit: Payer: Self-pay

## 2020-07-20 ENCOUNTER — Emergency Department (HOSPITAL_COMMUNITY): Payer: BC Managed Care – PPO

## 2020-07-20 ENCOUNTER — Emergency Department (HOSPITAL_COMMUNITY)
Admission: EM | Admit: 2020-07-20 | Discharge: 2020-07-21 | Disposition: A | Discharge status: Home / Self Care | Payer: BC Managed Care – PPO | Attending: Emergency Medicine | Admitting: Emergency Medicine

## 2020-07-20 DIAGNOSIS — Z5321 Procedure and treatment not carried out due to patient leaving prior to being seen by health care provider: Secondary | ICD-10-CM | POA: Insufficient documentation

## 2020-07-20 DIAGNOSIS — R05 Cough: Secondary | ICD-10-CM | POA: Insufficient documentation

## 2020-07-20 DIAGNOSIS — R0981 Nasal congestion: Secondary | ICD-10-CM | POA: Insufficient documentation

## 2020-07-20 DIAGNOSIS — M545 Low back pain: Secondary | ICD-10-CM | POA: Insufficient documentation

## 2020-07-20 NOTE — ED Triage Notes (Signed)
Pt reports not feeling well over the last week. Reports cough ,congestion, runny nose. Denies recent fever. C/o some low back pain as well. Denies urinary symptoms.

## 2020-07-20 NOTE — ED Notes (Signed)
Pt came to [facility] asking about wait time. Pt stated she isn't waiting any longer and is leaving to go to Urgent Care

## 2020-09-08 ENCOUNTER — Encounter (HOSPITAL_COMMUNITY): Payer: Self-pay | Admitting: Emergency Medicine

## 2020-09-08 ENCOUNTER — Emergency Department (HOSPITAL_COMMUNITY)
Admission: EM | Admit: 2020-09-08 | Discharge: 2020-09-08 | Disposition: A | Discharge status: Home / Self Care | Payer: BC Managed Care – PPO | Attending: Emergency Medicine | Admitting: Emergency Medicine

## 2020-09-08 ENCOUNTER — Other Ambulatory Visit: Payer: Self-pay

## 2020-09-08 DIAGNOSIS — F17210 Nicotine dependence, cigarettes, uncomplicated: Secondary | ICD-10-CM | POA: Insufficient documentation

## 2020-09-08 DIAGNOSIS — Z79899 Other long term (current) drug therapy: Secondary | ICD-10-CM | POA: Diagnosis not present

## 2020-09-08 DIAGNOSIS — Y93E5 Activity, floor mopping and cleaning: Secondary | ICD-10-CM | POA: Diagnosis not present

## 2020-09-08 DIAGNOSIS — X501XXA Overexertion from prolonged static or awkward postures, initial encounter: Secondary | ICD-10-CM | POA: Insufficient documentation

## 2020-09-08 DIAGNOSIS — M545 Low back pain, unspecified: Secondary | ICD-10-CM | POA: Insufficient documentation

## 2020-09-08 DIAGNOSIS — I10 Essential (primary) hypertension: Secondary | ICD-10-CM | POA: Diagnosis not present

## 2020-09-08 MED ORDER — PREDNISONE 10 MG (21) PO TBPK: ORAL_TABLET | Freq: Every day | ORAL | 0 refills | Status: AC

## 2020-09-08 MED ORDER — PREDNISONE 10 MG (21) PO TBPK: ORAL_TABLET | Freq: Every day | ORAL | 0 refills | Status: DC

## 2020-09-08 MED ORDER — PREDNISONE 20 MG PO TABS
60.0000 mg | ORAL_TABLET | Freq: Once | ORAL | Status: AC
  Administered 2020-09-08: 60 mg via ORAL
  Filled 2020-09-08: qty 3

## 2020-09-08 NOTE — Discharge Instructions (Addendum)
I prescribed you a steroid medication called prednisone.  You are going to take this in the morning for the next 10 days.  Please take it earlier in the day, as it can be stimulating and make it difficult to sleep.  I recommend a combination of tylenol and ibuprofen for management of your pain. You can take a low dose of both at the same time. I recommend 325 mg of Tylenol combined with 400 mg of ibuprofen. This is one regular Tylenol and two regular ibuprofen. You can take these 2-3 times for day for your pain. Please try to take these medications with a small amount of food as well to prevent upsetting your stomach.  Also, please consider topical pain relieving creams such as Voltaran Gel, BioFreeze, or Icy Hot. There is also a pain relieving cream made by [employer]. You should be able to find all of these at your local pharmacy.   Please return to the ER if you develop any new or worsening symptoms.  It was a pleasure to meet you.

## 2020-09-08 NOTE — ED Triage Notes (Signed)
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 Pt has tried OTC meds and heat packs but pain continues to get worse. No bowel or bladder issues.

## 2020-09-08 NOTE — ED Provider Notes (Signed)
[facility] EMERGENCY DEPARTMENT Provider Note   CSN: [id number] Arrival date & time: 09/08/20  1015     History Chief Complaint  Patient presents with  . Back Pain    Christy Leblanc is a 49 y.o. female.  HPI Patient is a 49 year old female with a medical history as noted below.  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  As she continued to work over the next 24 hours she states that her pain worsened.  She now reports moderate diffuse low back pain.  Worsens with any movement.  Reports associated difficulty sleeping secondary to her pain.  She denies any numbness, weakness, fevers, chills, bowel or bladder incontinence.    Past Medical History:  Diagnosis Date  . Headache   . Hypertension     Patient Active Problem List   Diagnosis Date Noted  . Chronic migraine without aura without status migrainosus, not intractable 05/08/2015  . Tobacco abuse 05/08/2015    Past Surgical History:  Procedure Laterality Date  . CESAREAN SECTION    . FOOT SURGERY     Right foot surgery  . TUBAL LIGATION       OB History   No obstetric history on file.     Family History  Problem Relation Age of Onset  . Thyroid disease Mother   . Hypertension Mother   . Stroke Father     Social History   Tobacco Use  . Smoking status: Current Some Day Smoker    Packs/day: 0.25    Types: Cigarettes  . Smokeless tobacco: Never Used  Vaping Use  . Vaping Use: Never used  Substance Use Topics  . Alcohol use: Yes    Comment: occasional  . Drug use: No    Home Medications Prior to Admission medications   Medication Sig Start Date End Date Taking? Authorizing Provider  hydrochlorothiazide (HYDRODIURIL) 12.5 MG tablet Take 12.5 mg by mouth daily.  07/26/19   [provider]  omeprazole (PRILOSEC) 40 MG capsule Take 40 mg by  mouth daily.     [provider]  oxybutynin (DITROPAN XL) 5 MG 24 hr tablet Take 5 mg by mouth daily.     [provider]  oxyCODONE (ROXICODONE) 5 MG immediate release tablet Take 1 tablet (5 mg total) by mouth every 6 (six) hours as needed for severe pain. 09/07/19   Kendrick, Caitlyn S, PA-C  predniSONE (STERAPRED UNI-PAK 21 TAB) 10 MG (21) TBPK tablet Take by mouth daily. Take 6 tabs by mouth daily  for 2 days, then 5 tabs for 2 days, then 4 tabs for 2 days, then 3 tabs for 2 days, 2 tabs for 2 days, then 1 tab by mouth daily for 2 days 09/08/20   Placido Sou, PA-C  rizatriptan (MAXALT-MLT) 10 MG disintegrating tablet Take 1 tablet (10 mg total) by mouth as needed for migraine. May repeat in 2 hours if needed.  Maximum 2 tablets in 24 hours 08/12/19   Shon Millet R, DO  tiZANidine (ZANAFLEX) 4 MG tablet Take 1/2 to 1 tablet at bedtime as needed. 12/12/19   [name], DO  topiramate (TOPAMAX) 50 MG tablet TAKE 2 TABLETS(100 MG) BY MOUTH AT BEDTIME 06/26/20   [name], DO    Allergies    Other and Morphine and related  Review of Systems  Review of Systems  Constitutional: Negative for chills and fever.  Musculoskeletal: Positive for back pain and myalgias.  Neurological: Negative for weakness and numbness.   Physical Exam Updated Vital Signs BP 137/88 (BP Location: Left Arm)   Pulse 94   Temp 98.7 F (37.1 C) (Oral)   Resp 15   Ht 5\' 3"  (1.6 m)   Wt 83.9 kg   SpO2 100%   BMI 32.77 kg/m   Physical Exam Vitals and nursing note reviewed.  Constitutional:      General: She is not in acute distress.    Appearance: Normal appearance. She is well-developed and normal weight.  HENT:     Head: Normocephalic and atraumatic.     Right Ear: External ear normal.     Left Ear: External ear normal.  Eyes:     General: No scleral icterus.       Right eye: No discharge.        Left eye: No discharge.     Conjunctiva/sclera: Conjunctivae normal.  Neck:      Trachea: No tracheal deviation.  Cardiovascular:     Rate and Rhythm: Normal rate.  Pulmonary:     Effort: Pulmonary effort is normal. No respiratory distress.     Breath sounds: No stridor.  Abdominal:     General: There is no distension.  Musculoskeletal:        General: Tenderness present. No swelling or deformity.     Cervical back: Neck supple.     Comments: Diffuse TTP noted along the bilateral lumbar paraspinal musculature.  No midline spine pain.  Strength is 5 out of 5 in the lower extremities.  Distal sensation intact.  Palpable pedal pulses.  Skin:    General: Skin is warm and dry.     Findings: No rash.  Neurological:     General: No focal deficit present.     Mental Status: She is alert and oriented to person, place, and time.     Cranial Nerves: Cranial nerve deficit: no gross deficits.    ED Results / Procedures / Treatments   Labs (all labs ordered are listed, but only abnormal results are displayed) Labs Reviewed - No data to display  EKG None  Radiology No results found.  Procedures Procedures (including critical care time)  Medications Ordered in ED Medications  predniSONE (DELTASONE) tablet 60 mg (has no administration in time range)   ED Course  I have reviewed the triage vital signs and the nursing notes.  Pertinent labs & imaging results that were available during my care of the patient were reviewed by me and considered in my medical decision making (see chart for details).    MDM Rules/Calculators/A&P                          Patient is a 49 year old female who presents to the emergency department due to musculoskeletal low back pain.  Physical exam is generally reassuring.  She is neurovascularly intact in the lower extremities.  No midline spine pain.  No weakness.  Patient has diffuse tenderness along the musculature of the lumbar spine.  No radiation of pain.  We discussed continued pain management with Tylenol and ibuprofen.  We  discussed dosing.  Patient offered a course of muscle relaxers but she declined.  Patient requests a prednisone taper.  She was given a dose of prednisone here in the emergency department.  No history of diabetes mellitus.  She understands  she needs to return to the ER with any new or worsening symptoms.  Her questions were answered and she was amicable at the time of discharge.  Vital signs are stable.  She is afebrile, nontachycardic, not hypoxic.  Final Clinical Impression(s) / ED Diagnoses Final diagnoses:  Acute bilateral low back pain without sciatica    Rx / DC Orders ED Discharge Orders         Ordered    predniSONE (STERAPRED UNI-PAK 21 TAB) 10 MG (21) TBPK tablet  Daily,   Status:  Discontinued        09/08/20 1432    predniSONE (STERAPRED UNI-PAK 21 TAB) 10 MG (21) TBPK tablet  Daily        09/08/20 1433           Placido Sou, PA-C 09/08/20 1439    Terald Sleeper, MD 09/08/20 1819

## 2020-09-23 NOTE — Progress Notes (Deleted)
NEUROLOGY FOLLOW UP OFFICE NOTE  Kiri Hinderliter [id number]  HISTORY OF PRESENT ILLNESS: Christy Leblanc is a 49 year old right-handed woman who is a current smoker who follows up for migraines.  UPDATE: Started topiramate in September.  Side effects on 100mg  so dose was reduced back to 50mg  at bedtime.   Current analgesic:Tylenol Current triptan:  Rizatriptan 10mg  Current muscle relaxer: tizanidine 2 to 4mg  at bedtime (for neck and occipital pain) Antihypertensive medications:HCTZ Antidepressant medications:none Anticonvulsant medications:topiramate 50mg  at bedtime Vitamins/Herbal/Supplements:none Other therapy:none  Caffeine:Coffee daily Alcohol:no Smoker:Current smoker Diet:Only eats dinner.Drinks water Exercise:Not routine Depression/stress:Family stress Sleep hygiene:poor  HISTORY: Migraine: Onset: Since 49 years old old following a head injury with mild concussion.Cans in a grocery store fell on her head. Location:Starts bilaterally in back of neck and travels up to the front.Sometimes just right-sidedor left-sided Quality:Throbbing, pressure Initial intensity:9-10/10; September 2016: 9-10/10 Aura:no Prodrome:no Associated symptoms:Head feels tingling, photophobia, phonophobia, phonosensitivity in right ear.Blurred vision in either eye.No nausea Initial Duration:All day; September 2016: Less than 2 hours with Maxalt Initial Frequency:every other day; September 2016: 15 headache days per month (10 days severe) Triggers/exacerbating factors:Smells of cleaning fluid and noise from residence trigger it. Relieving factors:Keeping still Activity:Functions but rather would lay down  Primary stabbing headache: In February 2020, she started getting severe left greater than right stabbing eye pain. From February to May 2020, they lasted 5 minutes. Then since June, they started lasting 5 seconds but  several in a row. There may have been slightly more conjunctival injection but no ptosis. They were occurring daily but only 1 or 2 during early summer.  By late summer 2020, she started mostly having her typical headaches, starting from mild and progressing to severe. She wakes up in the morning and lasts all day. They may occur 5 days a week but she may have a week without headaches.  Cervicogenic Headache: In September 2020, she developed neck pain with some tingling in back of head with right sided neck pain.  She went to the ED where she took a prednisone taper and wore a neck brace.  She began waking up about every other day with a dull right occipital headache due to the neck discomfort.  It may last 1/2 the day.  Past abortive therapy:sumatriptan 50mg , Tylenol, meloxicam, diclofenac, Vicodin, naproxen, Maxalt MLT 10mg  (effective), Excedrin Past preventative therapy:topiramate 100mg  (effective) Past muscle relaxants: Flexeril 10mg  twice daily PRN  CT of head from 04/15/15 was normal.   Family history of headache:Mother.No family history of aneurysms.  PAST MEDICAL HISTORY: Past Medical History:  Diagnosis Date  . Headache   . Hypertension     MEDICATIONS: Current Outpatient Medications on File Prior to Visit  Medication Sig Dispense Refill  . hydrochlorothiazide (HYDRODIURIL) 12.5 MG tablet Take 12.5 mg by mouth daily.     July omeprazole (PRILOSEC) 40 MG capsule Take 40 mg by mouth daily.     09-23-1976 oxybutynin (DITROPAN XL) 5 MG 24 hr tablet Take 5 mg by mouth daily.     05-06-2000 oxyCODONE (ROXICODONE) 5 MG immediate release tablet Take 1 tablet (5 mg total) by mouth every 6 (six) hours as needed for severe pain. 10 tablet 0  . predniSONE (STERAPRED UNI-PAK 21 TAB) 10 MG (21) TBPK tablet Take by mouth daily. Take 6 tabs by mouth daily  for 2 days, then 5 tabs for 2 days, then 4 tabs for 2 days, then 3 tabs for 2 days, 2 tabs for 2 days, then 1  tab by mouth daily for 2 days 42  tablet 0  . rizatriptan (MAXALT-MLT) 10 MG disintegrating tablet Take 1 tablet (10 mg total) by mouth as needed for migraine. May repeat in 2 hours if needed.  Maximum 2 tablets in 24 hours 9 tablet 3  . tiZANidine (ZANAFLEX) 4 MG tablet Take 1/2 to 1 tablet at bedtime as needed. 30 tablet 5  . topiramate (TOPAMAX) 50 MG tablet TAKE 2 TABLETS(100 MG) BY MOUTH AT BEDTIME 60 tablet 2   No current facility-administered medications on file prior to visit.    ALLERGIES: Allergies  Allergen Reactions  . Other Other (See Comments)    Narcotics pain medications don't work for patient   . Morphine And Related Rash    Breakouts     FAMILY HISTORY: Family History  Problem Relation Age of Onset  . Thyroid disease Mother   . Hypertension Mother   . Stroke Father    SOCIAL HISTORY: Social History   Socioeconomic History  . Marital status: Single    Spouse name: Not on file  . Number of children: 4  . Years of education: 49  . Highest education level: Not on file  Occupational History  . Occupation: [employer] school  Tobacco Use  . Smoking status: Current Some Day Smoker    Packs/day: 0.25    Types: Cigarettes  . Smokeless tobacco: Never Used  Vaping Use  . Vaping Use: Never used  Substance and Sexual Activity  . Alcohol use: Yes    Comment: occasional  . Drug use: No  . Sexual activity: Not on file  Other Topics Concern  . Not on file  Social History Narrative   Live in 2 level home with sons; R handed; college now; caffeine 1 cup coffee/day;    Social Determinants of Health   Financial Resource Strain:   . Difficulty of Paying Living Expenses: Not on file  Food Insecurity:   . Worried About Programme researcher, broadcasting/film/video in the Last Year: Not on file  . Ran Out of Food in the Last Year: Not on file  Transportation Needs:   . Lack of Transportation (Medical): Not on file  . Lack of Transportation (Non-Medical): Not on file  Physical Activity:   . Days of Exercise per Week:  Not on file  . Minutes of Exercise per Session: Not on file  Stress:   . Feeling of Stress : Not on file  Social Connections:   . Frequency of Communication with Friends and Family: Not on file  . Frequency of Social Gatherings with Friends and Family: Not on file  . Attends Religious Services: Not on file  . Active Member of Clubs or Organizations: Not on file  . Attends Banker Meetings: Not on file  . Marital Status: Not on file  Intimate Partner Violence:   . Fear of Current or Ex-Partner: Not on file  . Emotionally Abused: Not on file  . Physically Abused: Not on file  . Sexually Abused: Not on file    PHYSICAL EXAM: *** General: No acute distress.  Patient appears well-groomed.   Head:  Normocephalic/atraumatic Eyes:  Fundi examined but not visualized Neck: supple, no paraspinal tenderness, full range of motion Heart:  Regular rate and rhythm Lungs:  Clear to auscultation bilaterally Back: No paraspinal tenderness Neurological Exam: alert and oriented to person, place, and time. Attention span and concentration intact, recent and remote memory intact, fund of knowledge intact.  Speech fluent and  not dysarthric, language intact.  CN II-XII intact. Bulk and tone normal, muscle strength 5/5 throughout.  Sensation to light touch, temperature and vibration intact.  Deep tendon reflexes 2+ throughout, toes downgoing.  Finger to nose and heel to shin testing intact.  Gait normal, Romberg negative.  IMPRESSION: 1.  Migraine without aura 2.  Cervicogenic headache 3.  ***  PLAN: ***  Shon Millet, DO  CC: ***

## 2020-09-25 ENCOUNTER — Ambulatory Visit: Payer: BC Managed Care – PPO | Admitting: Neurology

## 2020-09-25 ENCOUNTER — Encounter: Payer: Self-pay | Admitting: Neurology

## 2020-09-25 DIAGNOSIS — Z029 Encounter for administrative examinations, unspecified: Secondary | ICD-10-CM

## 2021-01-01 ENCOUNTER — Other Ambulatory Visit: Payer: Self-pay | Admitting: Obstetrics and Gynecology

## 2021-01-01 DIAGNOSIS — R928 Other abnormal and inconclusive findings on diagnostic imaging of breast: Secondary | ICD-10-CM

## 2021-02-12 ENCOUNTER — Other Ambulatory Visit: Payer: Self-pay

## 2021-02-12 ENCOUNTER — Ambulatory Visit
Admission: RE | Admit: 2021-02-12 | Discharge: 2021-02-12 | Disposition: A | Discharge status: Home / Self Care | Payer: BC Managed Care – PPO | Source: Ambulatory Visit | Attending: Obstetrics and Gynecology | Admitting: Obstetrics and Gynecology

## 2021-02-12 DIAGNOSIS — R928 Other abnormal and inconclusive findings on diagnostic imaging of breast: Secondary | ICD-10-CM

## 2022-05-26 ENCOUNTER — Emergency Department (HOSPITAL_COMMUNITY)
Admission: EM | Admit: 2022-05-26 | Discharge: 2022-05-26 | Disposition: A | Discharge status: Home / Self Care | Payer: BC Managed Care – PPO | Attending: Emergency Medicine | Admitting: Emergency Medicine

## 2022-05-26 ENCOUNTER — Encounter (HOSPITAL_COMMUNITY): Payer: Self-pay | Admitting: Emergency Medicine

## 2022-05-26 ENCOUNTER — Emergency Department (HOSPITAL_COMMUNITY): Payer: BC Managed Care – PPO

## 2022-05-26 DIAGNOSIS — S6991XA Unspecified injury of right wrist, hand and finger(s), initial encounter: Secondary | ICD-10-CM | POA: Insufficient documentation

## 2022-05-26 DIAGNOSIS — W228XXA Striking against or struck by other objects, initial encounter: Secondary | ICD-10-CM | POA: Insufficient documentation

## 2022-05-26 NOTE — ED Provider Triage Note (Signed)
Emergency Medicine Provider Triage Evaluation Note  Christy Leblanc , a 51 y.o. female  was evaluated in triage.  Pt complains of right ring finger pain after accidentally slamming her finger in a door approximately 2 weeks ago.  Complains of swelling and pain.  Movement grossly intact  Review of Systems  Positive: Finger pain Negative:   Physical Exam  BP 121/76 (BP Location: Left Arm)   Pulse 95   Temp 98.3 F (36.8 C) (Oral)   Resp 16   SpO2 97%  Gen:   Awake, no distress   Resp:  Normal effort  MSK:   Moves extremities without difficulty  Other:    Medical Decision Making  Medically screening exam initiated at 4:15 PM.  Appropriate orders placed.  Christy Leblanc was informed that the remainder of the evaluation will be completed by another provider, this initial triage assessment does not replace that evaluation, and the importance of remaining in the ED until their evaluation is complete.     Darrick Grinder, PA-C 05/26/22 1620

## 2022-05-26 NOTE — ED Notes (Signed)
Patient transported to X-ray 

## 2022-05-26 NOTE — ED Provider Notes (Signed)
[facility] EMERGENCY DEPARTMENT Provider Note   CSN: [id number] Arrival date & time: 05/26/22  1604     History  Chief Complaint  Patient presents with   Finger Injury    Christy Leblanc is a 51 y.o. female presented to ED complaining of right hand pain.  She reports she accidentally slammed her right fourth finger in a door approximately 3 weeks ago.  She had a ring on at the time but took it off the same day as her finger with swelling.  The swelling gets overall gone down, but continues having pain, specifically over the right fourth PIP.  She is able to fully flex and extend her hand.  She is right-handed  HPI     Home Medications Prior to Admission medications   Medication Sig Start Date End Date Taking? Authorizing Provider  hydrochlorothiazide (HYDRODIURIL) 12.5 MG tablet Take 12.5 mg by mouth daily.  07/26/19   [provider]  omeprazole (PRILOSEC) 40 MG capsule Take 40 mg by mouth daily.     [provider]  oxybutynin (DITROPAN XL) 5 MG 24 hr tablet Take 5 mg by mouth daily.     [provider]  oxyCODONE (ROXICODONE) 5 MG immediate release tablet Take 1 tablet (5 mg total) by mouth every 6 (six) hours as needed for severe pain. 09/07/19   Kendrick, Caitlyn S, PA-C  predniSONE (STERAPRED UNI-PAK 21 TAB) 10 MG (21) TBPK tablet Take by mouth daily. Take 6 tabs by mouth daily  for 2 days, then 5 tabs for 2 days, then 4 tabs for 2 days, then 3 tabs for 2 days, 2 tabs for 2 days, then 1 tab by mouth daily for 2 days 09/08/20   Placido Sou, PA-C  rizatriptan (MAXALT-MLT) 10 MG disintegrating tablet Take 1 tablet (10 mg total) by mouth as needed for migraine. May repeat in 2 hours if needed.  Maximum 2 tablets in 24 hours 08/12/19   Shon Millet R, DO  tiZANidine (ZANAFLEX) 4 MG tablet Take 1/2 to 1 tablet at bedtime as needed. 12/12/19   [name], DO  topiramate (TOPAMAX) 50 MG tablet TAKE 2 TABLETS(100 MG) BY MOUTH AT  BEDTIME 06/26/20   [name], DO      Allergies    Other and Morphine and related    Review of Systems   Review of Systems  Physical Exam Updated Vital Signs BP 121/76 (BP Location: Left Arm)   Pulse 95   Temp 98.3 F (36.8 C) (Oral)   Resp 16   SpO2 97%  Physical Exam Constitutional:      General: She is not in acute distress. HENT:     Head: Normocephalic and atraumatic.  Eyes:     Conjunctiva/sclera: Conjunctivae normal.     Pupils: Pupils are equal, round, and reactive to light.  Cardiovascular:     Rate and Rhythm: Normal rate and regular rhythm.  Musculoskeletal:     Comments: Patient is able to fully flex and extend all fingers and the hand, to form fist.  She has tenderness over the right fourth PIP without inflammation or warmth.  There is no visible deformity of the fingers  Skin:    General: Skin is warm and dry.  Neurological:     General: No focal deficit present.     Mental Status: She is alert. Mental status is at baseline.     Motor: No weakness.     ED Results / Procedures /  Treatments   Labs (all labs ordered are listed, but only abnormal results are displayed) Labs Reviewed - No data to display  EKG None  Radiology DG Hand Complete Right  Result Date: 05/26/2022 CLINICAL DATA:  Trauma. EXAM: RIGHT HAND - COMPLETE 3+ VIEW COMPARISON:  Right hand x-ray 10/24/2016 FINDINGS: There is no evidence of fracture or dislocation. There is no evidence of arthropathy or other focal bone abnormality. Soft tissues are unremarkable. IMPRESSION: Negative. Electronically Signed   By: Darliss Cheney M.D.   On: 05/26/2022 16:38    Procedures Procedures    Medications Ordered in ED Medications - No data to display  ED Course/ Medical Decision Making/ A&P                           Medical Decision Making Amount and/or Complexity of Data Reviewed Radiology: ordered.   Patient is here 3 weeks after traumatic injury to her right fourth finger.  She has  full range of motion.  She is neurologically intact.  X-rays of the hand were ordered and personally reviewed, showing no evident fracture.  Explained it is possible that she may have a hairline fracture that is not visible, or else she simply has some traumatic inflammation of the joint.  Because she is right-handed is affecting her ability to use her hand, I did offer her a hand specialist contact information for follow-up.  I advised that she continue with conservative measures for at least another 2 weeks, but if she is still having persistent symptoms at that time, she could see a specialist.  She verbalized understanding and agreement.  Doubt infection, compartment syndrome. Doubt tendon rupture        Final Clinical Impression(s) / ED Diagnoses Final diagnoses:  Injury of finger of right hand, initial encounter    Rx / DC Orders ED Discharge Orders     None         Keshonna Valvo, Kermit Balo, MD 05/26/22 [phone]

## 2022-05-26 NOTE — ED Notes (Signed)
Discharge instructions reviewed with patient. Patient verbalized understanding of instructions. Follow-up care and medications were reviewed. Patient ambulatory with steady gait. VSS upon discharge.  ?

## 2022-05-26 NOTE — ED Triage Notes (Signed)
Patient complains of right ring finger pain that started approximately two weeks ago, patient states she thinks she accidentally slammed it in a door. Patient states finger has been hurting and swollen without any improvement.

## 2022-08-15 ENCOUNTER — Other Ambulatory Visit: Payer: Self-pay | Admitting: Nurse Practitioner

## 2022-08-15 ENCOUNTER — Ambulatory Visit: Payer: Self-pay

## 2022-08-15 DIAGNOSIS — M546 Pain in thoracic spine: Secondary | ICD-10-CM

## 2022-08-15 DIAGNOSIS — M25512 Pain in left shoulder: Secondary | ICD-10-CM

## 2023-08-02 ENCOUNTER — Ambulatory Visit (HOSPITAL_COMMUNITY)
Admission: EM | Admit: 2023-08-02 | Discharge: 2023-08-02 | Discharge status: Against Medical Advice | Payer: BC Managed Care – PPO

## 2023-08-02 NOTE — ED Notes (Signed)
Called for patient in lobby.  Patient access reports patient left.

## 2023-08-08 ENCOUNTER — Emergency Department (HOSPITAL_COMMUNITY)
Admission: EM | Admit: 2023-08-08 | Discharge: 2023-08-08 | Disposition: A | Discharge status: Home / Self Care | Payer: BC Managed Care – PPO | Attending: Emergency Medicine | Admitting: Emergency Medicine

## 2023-08-08 ENCOUNTER — Other Ambulatory Visit: Payer: Self-pay

## 2023-08-08 ENCOUNTER — Encounter (HOSPITAL_COMMUNITY): Payer: Self-pay

## 2023-08-08 ENCOUNTER — Emergency Department (HOSPITAL_COMMUNITY)
Admission: EM | Admit: 2023-08-08 | Discharge: 2023-08-09 | Disposition: A | Discharge status: Home / Self Care | Payer: BC Managed Care – PPO | Attending: Emergency Medicine | Admitting: Emergency Medicine

## 2023-08-08 ENCOUNTER — Encounter (HOSPITAL_COMMUNITY): Payer: Self-pay | Admitting: Emergency Medicine

## 2023-08-08 DIAGNOSIS — I10 Essential (primary) hypertension: Secondary | ICD-10-CM | POA: Insufficient documentation

## 2023-08-08 DIAGNOSIS — K0889 Other specified disorders of teeth and supporting structures: Secondary | ICD-10-CM | POA: Insufficient documentation

## 2023-08-08 DIAGNOSIS — K029 Dental caries, unspecified: Secondary | ICD-10-CM | POA: Diagnosis not present

## 2023-08-08 LAB — CBC WITH DIFFERENTIAL/PLATELET
Abs Immature Granulocytes: 0.03 10*3/uL (ref 0.00–0.07)
Basophils Absolute: 0 10*3/uL (ref 0.0–0.1)
Basophils Relative: 0 %
Eosinophils Absolute: 0.3 10*3/uL (ref 0.0–0.5)
Eosinophils Relative: 3 %
HCT: 31.5 % — ABNORMAL LOW (ref 36.0–46.0)
Hemoglobin: 10 g/dL — ABNORMAL LOW (ref 12.0–15.0)
Immature Granulocytes: 0 %
Lymphocytes Relative: 42 %
Lymphs Abs: 3.8 10*3/uL (ref 0.7–4.0)
MCH: 30.7 pg (ref 26.0–34.0)
MCHC: 31.7 g/dL (ref 30.0–36.0)
MCV: 96.6 fL (ref 80.0–100.0)
Monocytes Absolute: 0.8 10*3/uL (ref 0.1–1.0)
Monocytes Relative: 9 %
Neutro Abs: 4.1 10*3/uL (ref 1.7–7.7)
Neutrophils Relative %: 46 %
Platelets: 249 10*3/uL (ref 150–400)
RBC: 3.26 MIL/uL — ABNORMAL LOW (ref 3.87–5.11)
RDW: 12.9 % (ref 11.5–15.5)
WBC: 9.1 10*3/uL (ref 4.0–10.5)
nRBC: 0 % (ref 0.0–0.2)

## 2023-08-08 LAB — COMPREHENSIVE METABOLIC PANEL
ALT: 17 U/L (ref 0–44)
AST: 19 U/L (ref 15–41)
Albumin: 3.5 g/dL (ref 3.5–5.0)
Alkaline Phosphatase: 89 U/L (ref 38–126)
Anion gap: 7 (ref 5–15)
BUN: 9 mg/dL (ref 6–20)
CO2: 21 mmol/L — ABNORMAL LOW (ref 22–32)
Calcium: 7.7 mg/dL — ABNORMAL LOW (ref 8.9–10.3)
Chloride: 110 mmol/L (ref 98–111)
Creatinine, Ser: 1.21 mg/dL — ABNORMAL HIGH (ref 0.44–1.00)
GFR, Estimated: 55 mL/min — ABNORMAL LOW (ref >60)
Glucose, Bld: 98 mg/dL (ref 70–99)
Potassium: 3.3 mmol/L — ABNORMAL LOW (ref 3.5–5.1)
Sodium: 138 mmol/L (ref 135–145)
Total Bilirubin: 0.3 mg/dL (ref 0.3–1.2)
Total Protein: 6.8 g/dL (ref 6.5–8.1)

## 2023-08-08 MED ORDER — KETOROLAC TROMETHAMINE 15 MG/ML IJ SOLN
15.0000 mg | Freq: Once | INTRAMUSCULAR | Status: AC
  Administered 2023-08-08: 15 mg via INTRAMUSCULAR
  Filled 2023-08-08: qty 1

## 2023-08-08 MED ORDER — LIDOCAINE VISCOUS HCL 2 % MT SOLN
15.0000 mL | Freq: Once | OROMUCOSAL | Status: AC
  Administered 2023-08-09: 15 mL via OROMUCOSAL
  Filled 2023-08-08: qty 15

## 2023-08-08 MED ORDER — FENTANYL CITRATE PF 50 MCG/ML IJ SOSY
50.0000 ug | PREFILLED_SYRINGE | Freq: Once | INTRAMUSCULAR | Status: AC
  Administered 2023-08-08: 50 ug via INTRAVENOUS
  Filled 2023-08-08: qty 1

## 2023-08-08 MED ORDER — AMOXICILLIN-POT CLAVULANATE 875-125 MG PO TABS: 1.0000 | ORAL_TABLET | Freq: Two times a day (BID) | ORAL | 0 refills | Status: AC

## 2023-08-08 NOTE — ED Triage Notes (Signed)
Pt reports with right lower dental pain and swelling x 4 days.

## 2023-08-08 NOTE — ED Triage Notes (Signed)
Patient c/o right sided dental and facial pain x 1 week. States she has a broken tooth on bottom right side.

## 2023-08-08 NOTE — ED Provider Notes (Signed)
[facility] EMERGENCY DEPARTMENT AT [facility] Provider Note   CSN: [id number] Arrival date & time: 08/08/23  2217     History  Chief Complaint  Patient presents with   Dental Pain    Christy Leblanc is a 52 y.o. female.  Patient presents to the emergency department complaining of right-sided dental pain which been ongoing for 1 week.  She was evaluated earlier today at [facility] and was given a shot of Toradol but states that her pain has persisted.  She knows she has a broken lower molar on the right side.  She states she has been unable to get dental care due to insurance coverage issues.  She denies fever, nausea, vomiting, shortness of breath, difficulty swallowing, speech changes.  Past medical history significant for hypertension   Dental Pain      Home Medications Prior to Admission medications   Medication Sig Start Date End Date Taking? Authorizing Provider  lidocaine (XYLOCAINE) 2 % solution Use as directed 15 mLs in the mouth or throat as needed for mouth pain. 08/09/23  Yes Barrie Dunker B, PA-C  amoxicillin-clavulanate (AUGMENTIN) 875-125 MG tablet Take 1 tablet by mouth every 12 (twelve) hours for 10 days. 08/08/23 08/18/23  Gowens, Mariah L, PA-C  hydrochlorothiazide (HYDRODIURIL) 12.5 MG tablet Take 12.5 mg by mouth daily.  07/26/19   [provider]  omeprazole (PRILOSEC) 40 MG capsule Take 40 mg by mouth daily.     [provider]  oxybutynin (DITROPAN XL) 5 MG 24 hr tablet Take 5 mg by mouth daily.     [provider]  oxyCODONE (ROXICODONE) 5 MG immediate release tablet Take 1 tablet (5 mg total) by mouth every 6 (six) hours as needed for severe pain. 09/07/19   Kendrick, Caitlyn S, PA-C  predniSONE (STERAPRED UNI-PAK 21 TAB) 10 MG (21) TBPK tablet Take by mouth daily. Take 6 tabs by mouth daily  for 2 days, then 5 tabs for 2 days, then 4 tabs for 2 days, then 3 tabs for 2 days, 2 tabs for 2 days, then 1 tab by  mouth daily for 2 days 09/08/20   Placido Sou, PA-C  rizatriptan (MAXALT-MLT) 10 MG disintegrating tablet Take 1 tablet (10 mg total) by mouth as needed for migraine. May repeat in 2 hours if needed.  Maximum 2 tablets in 24 hours 08/12/19   Shon Millet R, DO  tiZANidine (ZANAFLEX) 4 MG tablet Take 1/2 to 1 tablet at bedtime as needed. 12/12/19   [name], DO  topiramate (TOPAMAX) 50 MG tablet TAKE 2 TABLETS(100 MG) BY MOUTH AT BEDTIME 06/26/20   Everlena Cooper, Adam R, DO      Allergies    Other and Morphine and codeine    Review of Systems   Review of Systems  Physical Exam Updated Vital Signs BP (!) 173/120   Pulse (!) 127   Temp 98.4 F (36.9 C) (Oral)   Resp 19   SpO2 98%  Physical Exam Vitals and nursing note reviewed.  Constitutional:      Appearance: Normal appearance.  HENT:     Head: Normocephalic and atraumatic.     Right Ear: External ear normal.     Left Ear: External ear normal.     Mouth/Throat:     Mouth: Mucous membranes are moist. No oral lesions or angioedema.     Tongue: Tongue does not deviate from midline.     Pharynx: Oropharynx is clear. Uvula midline. No uvula  swelling.     Tonsils: No tonsillar abscesses.      Comments: Large dental caries with surrounding gingival erythema, no area of fluctuance or induration, no sublingual, submandibular, or submental tenderness, swelling, fluctuance, or induration, widely patent airway, no signs of PTA/RPA, normal phonation Eyes:     Conjunctiva/sclera: Conjunctivae normal.  Cardiovascular:     Rate and Rhythm: Normal rate.  Pulmonary:     Effort: Pulmonary effort is normal.  Musculoskeletal:        General: Normal range of motion.     Cervical back: Neck supple.  Skin:    General: Skin is warm and dry.     Capillary Refill: Capillary refill takes less than 2 seconds.  Neurological:     Mental Status: She is alert. Mental status is at baseline.  Psychiatric:        Behavior: Behavior normal.     ED  Results / Procedures / Treatments   Labs (all labs ordered are listed, but only abnormal results are displayed) Labs Reviewed  CBC WITH DIFFERENTIAL/PLATELET - Abnormal; Notable for the following components:      Result Value   RBC 3.26 (*)    Hemoglobin 10.0 (*)    HCT 31.5 (*)    All other components within normal limits  COMPREHENSIVE METABOLIC PANEL - Abnormal; Notable for the following components:   Potassium 3.3 (*)    CO2 21 (*)    Creatinine, Ser 1.21 (*)    Calcium 7.7 (*)    GFR, Estimated 55 (*)    All other components within normal limits    EKG None  Radiology CT Soft Tissue Neck W Contrast  Result Date: 08/09/2023 CLINICAL DATA:  Right lower dental pain with neck swelling EXAM: CT NECK WITH CONTRAST TECHNIQUE: Multidetector CT imaging of the neck was performed using the standard protocol following the bolus administration of intravenous contrast. RADIATION DOSE REDUCTION: This exam was performed according to the departmental dose-optimization program which includes automated exposure control, adjustment of the mA and/or kV according to patient size and/or use of iterative reconstruction technique. CONTRAST:  75mL OMNIPAQUE IOHEXOL 300 MG/ML  SOLN COMPARISON:  None Available. FINDINGS: Pharynx and larynx: Normal. No mass or swelling. Salivary glands: No inflammation, mass, or stone. Thyroid: Normal. Lymph nodes: None enlarged or abnormal density. Vascular: Negative. Limited intracranial: Negative. Visualized orbits: Negative. Mastoids and visualized paranasal sinuses: Clear. Skeleton: No acute or aggressive process. Upper chest: Negative. Other: There is inflammatory change within the soft tissues superficial to the right mandibular body. No discrete abscess or fluid collection. IMPRESSION: Inflammatory change within the soft tissues superficial to the right mandibular body, probably odontogenic, though no clear causative lesion is identified. No discrete abscess or fluid  collection. Electronically Signed   By: Deatra Robinson M.D.   On: 08/09/2023 00:49    Procedures Procedures    Medications Ordered in ED Medications  fentaNYL (SUBLIMAZE) injection 50 mcg (50 mcg Intravenous Given 08/08/23 2322)  lidocaine (XYLOCAINE) 2 % viscous mouth solution 15 mL (15 mLs Mouth/Throat Given 08/09/23 0031)  iohexol (OMNIPAQUE) 300 MG/ML solution 75 mL (75 mLs Intravenous Contrast Given 08/09/23 0004)  metoCLOPramide (REGLAN) injection 10 mg (10 mg Intravenous Given 08/09/23 0030)  dexamethasone (DECADRON) injection 10 mg (10 mg Intravenous Given 08/09/23 0029)    ED Course/ Medical Decision Making/ A&P  Medical Decision Making Amount and/or Complexity of Data Reviewed Labs: ordered. Radiology: ordered.  Risk Prescription drug management.   This patient presents to the ED for concern of dental pain, this involves an extensive number of treatment options, and is a complaint that carries with it a high risk of complications and morbidity.  The differential diagnosis includes abscess, deep space infection, pulpitis, others   Co morbidities that complicate the patient evaluation  Hypertension   Lab Tests:  I Ordered, and personally interpreted labs.  The pertinent results include: Grossly unremarkable CBC, CMP   Imaging Studies ordered:  I ordered imaging studies including CT soft tissue neck with contrast I independently visualized and interpreted imaging which showed  Inflammatory change within the soft tissues superficial to the right  mandibular body, probably odontogenic, though no clear causative  lesion is identified. No discrete abscess or fluid collection.   I agree with the radiologist interpretation   Problem List / ED Course / Critical interventions / Medication management   I ordered medication including fentanyl, viscous lidocaine for pain Reevaluation of the patient after these medicines showed that the  patient stayed the same I have reviewed the patients home medicines and have made adjustments as needed   Social Determinants of Health:  Patient with issues affording dentistry   Test / Admission - Considered:  Patient with no acute findings on CT scan.  Obvious dental issue in the back right molar.  She was prescribed antibiotics earlier today which I recommend she continue to take.  I will prescribe the patient's request viscous lidocaine as that seemed to provide the most relief.  Patient understands she needs to follow-up with dentistry for definitive management.         Final Clinical Impression(s) / ED Diagnoses Final diagnoses:  Pain, dental    Rx / DC Orders ED Discharge Orders          Ordered    lidocaine (XYLOCAINE) 2 % solution  As needed        08/09/23 0152              Darrick Grinder, PA-C 08/09/23 0153    Gilda Crease, MD 08/09/23 2358

## 2023-08-08 NOTE — Discharge Instructions (Signed)
Take your antibiotics to completion even if you start to feel better. Follow up with a dentist for treatment of the tooth. Return to ED for worsening condition.

## 2023-08-08 NOTE — ED Provider Notes (Incomplete)
[name] EMERGENCY DEPARTMENT AT [facility] Provider Note   CSN: [id number] Arrival date & time: 08/08/23  2217     History {Add pertinent medical, surgical, social history, OB history to HPI:1} Chief Complaint  Patient presents with  . Dental Pain    Christy Leblanc is a 52 y.o. female.  Patient presents to the emergency department complaining of right-sided dental pain which been ongoing for 1 week.  She was evaluated earlier today at [facility] and was given a shot of Toradol but states that her pain has persisted.  She knows she has a broken lower molar on the right side.  She states she has been unable to get dental care due to insurance coverage issues.  She denies fever, nausea, vomiting, shortness of breath, difficulty swallowing, speech changes.  Past medical history significant for hypertension   Dental Pain      Home Medications Prior to Admission medications   Medication Sig Start Date End Date Taking? Authorizing Provider  amoxicillin-clavulanate (AUGMENTIN) 875-125 MG tablet Take 1 tablet by mouth every 12 (twelve) hours for 10 days. 08/08/23 08/18/23  Gowens, Mariah L, PA-C  hydrochlorothiazide (HYDRODIURIL) 12.5 MG tablet Take 12.5 mg by mouth daily.  07/26/19   [provider]  omeprazole (PRILOSEC) 40 MG capsule Take 40 mg by mouth daily.     [provider]  oxybutynin (DITROPAN XL) 5 MG 24 hr tablet Take 5 mg by mouth daily.     [provider]  oxyCODONE (ROXICODONE) 5 MG immediate release tablet Take 1 tablet (5 mg total) by mouth every 6 (six) hours as needed for severe pain. 09/07/19   Kendrick, Caitlyn S, PA-C  predniSONE (STERAPRED UNI-PAK 21 TAB) 10 MG (21) TBPK tablet Take by mouth daily. Take 6 tabs by mouth daily  for 2 days, then 5 tabs for 2 days, then 4 tabs for 2 days, then 3 tabs for 2 days, 2 tabs for 2 days, then 1 tab by mouth daily for 2 days 09/08/20   Placido Sou, PA-C  rizatriptan  (MAXALT-MLT) 10 MG disintegrating tablet Take 1 tablet (10 mg total) by mouth as needed for migraine. May repeat in 2 hours if needed.  Maximum 2 tablets in 24 hours 08/12/19   Shon Millet R, DO  tiZANidine (ZANAFLEX) 4 MG tablet Take 1/2 to 1 tablet at bedtime as needed. 12/12/19   [name], DO  topiramate (TOPAMAX) 50 MG tablet TAKE 2 TABLETS(100 MG) BY MOUTH AT BEDTIME 06/26/20   Everlena Cooper, Adam R, DO      Allergies    Other and Morphine and codeine    Review of Systems   Review of Systems  Physical Exam Updated Vital Signs BP (!) 173/120   Pulse (!) 127   Temp 98.4 F (36.9 C) (Oral)   Resp 19   SpO2 98%  Physical Exam Vitals and nursing note reviewed.  Constitutional:      Appearance: Normal appearance.  HENT:     Head: Normocephalic and atraumatic.     Right Ear: External ear normal.     Left Ear: External ear normal.     Mouth/Throat:     Mouth: Mucous membranes are moist. No oral lesions or angioedema.     Tongue: Tongue does not deviate from midline.     Pharynx: Oropharynx is clear. Uvula midline. No uvula swelling.     Tonsils: No tonsillar abscesses.      Comments: Large dental caries with  surrounding gingival erythema, no area of fluctuance or induration, no sublingual, submandibular, or submental tenderness, swelling, fluctuance, or induration, widely patent airway, no signs of PTA/RPA, normal phonation Eyes:     Conjunctiva/sclera: Conjunctivae normal.  Cardiovascular:     Rate and Rhythm: Normal rate.  Pulmonary:     Effort: Pulmonary effort is normal.  Musculoskeletal:        General: Normal range of motion.     Cervical back: Neck supple.  Skin:    General: Skin is warm and dry.     Capillary Refill: Capillary refill takes less than 2 seconds.  Neurological:     Mental Status: She is alert. Mental status is at baseline.  Psychiatric:        Behavior: Behavior normal.     ED Results / Procedures / Treatments   Labs (all labs ordered are listed,  but only abnormal results are displayed) Labs Reviewed  CBC WITH DIFFERENTIAL/PLATELET - Abnormal; Notable for the following components:      Result Value   RBC 3.26 (*)    Hemoglobin 10.0 (*)    HCT 31.5 (*)    All other components within normal limits  COMPREHENSIVE METABOLIC PANEL    EKG None  Radiology No results found.  Procedures Procedures  {Document cardiac monitor, telemetry assessment procedure when appropriate:1}  Medications Ordered in ED Medications  fentaNYL (SUBLIMAZE) injection 50 mcg (50 mcg Intravenous Given 08/08/23 2322)    ED Course/ Medical Decision Making/ A&P   {   Click here for ABCD2, HEART and other calculatorsREFRESH Note before signing :1}                              Medical Decision Making Amount and/or Complexity of Data Reviewed Labs: ordered. Radiology: ordered.  Risk Prescription drug management.   This patient presents to the ED for concern of dental pain, this involves an extensive number of treatment options, and is a complaint that carries with it a high risk of complications and morbidity.  The differential diagnosis includes abscess, deep space infection, pulpitis, others   Co morbidities that complicate the patient evaluation  Hypertension   Lab Tests:  I Ordered, and personally interpreted labs.  The pertinent results include:  ***   Imaging Studies ordered:  I ordered imaging studies including CT soft tissue neck with contrast I independently visualized and interpreted imaging which showed *** I agree with the radiologist interpretation   Cardiac Monitoring: / EKG:  The patient was maintained on a cardiac monitor.  I personally viewed and interpreted the cardiac monitored which showed an underlying rhythm of: ***   Consultations Obtained:  I requested consultation with the ***,  and discussed lab and imaging findings as well as pertinent plan - they recommend: ***   Problem List / ED Course / Critical  interventions / Medication management   I ordered medication including fentanyl for pain Reevaluation of the patient after these medicines showed that the patient stayed the same I have reviewed the patients home medicines and have made adjustments as needed   Social Determinants of Health:  Patient with issues affording dentistry   Test / Admission - Considered:  ***   {Document critical care time when appropriate:1} {Document review of labs and clinical decision tools ie heart score, Chads2Vasc2 etc:1}  {Document your independent review of radiology images, and any outside records:1} {Document your discussion with family members, caretakers, and with consultants:1} {  Document social determinants of health affecting pt's care:1} {Document your decision making why or why not admission, treatments were needed:1} Final Clinical Impression(s) / ED Diagnoses Final diagnoses:  None    Rx / DC Orders ED Discharge Orders     None

## 2023-08-08 NOTE — ED Provider Notes (Signed)
[place] EMERGENCY DEPARTMENT AT [facility] Provider Note   CSN: [id number] Arrival date & time: 08/08/23  1337     History  Chief Complaint  Patient presents with   Dental Pain    Christy Leblanc is a 52 y.o. female presents to the ED complaining of right sided dental pain for the last week.  States that it is the back lower tooth but has broken.  No fever, nausea, vomiting, body aches, chest pain, or shortness of breath.  No recent antibiotics.  Has not been able to follow-up with a dentist due to difficulties with insurance coverage.       Home Medications Prior to Admission medications   Medication Sig Start Date End Date Taking? Authorizing Provider  amoxicillin-clavulanate (AUGMENTIN) 875-125 MG tablet Take 1 tablet by mouth every 12 (twelve) hours for 10 days. 08/08/23 08/18/23 Yes Joss Friedel L, PA-C  hydrochlorothiazide (HYDRODIURIL) 12.5 MG tablet Take 12.5 mg by mouth daily.  07/26/19   [provider]  omeprazole (PRILOSEC) 40 MG capsule Take 40 mg by mouth daily.     [provider]  oxybutynin (DITROPAN XL) 5 MG 24 hr tablet Take 5 mg by mouth daily.     [provider]  oxyCODONE (ROXICODONE) 5 MG immediate release tablet Take 1 tablet (5 mg total) by mouth every 6 (six) hours as needed for severe pain. 09/07/19   Kendrick, Caitlyn S, PA-C  predniSONE (STERAPRED UNI-PAK 21 TAB) 10 MG (21) TBPK tablet Take by mouth daily. Take 6 tabs by mouth daily  for 2 days, then 5 tabs for 2 days, then 4 tabs for 2 days, then 3 tabs for 2 days, 2 tabs for 2 days, then 1 tab by mouth daily for 2 days 09/08/20   Placido Sou, PA-C  rizatriptan (MAXALT-MLT) 10 MG disintegrating tablet Take 1 tablet (10 mg total) by mouth as needed for migraine. May repeat in 2 hours if needed.  Maximum 2 tablets in 24 hours 08/12/19   Shon Millet R, DO  tiZANidine (ZANAFLEX) 4 MG tablet Take 1/2 to 1 tablet at bedtime as needed. 12/12/19   Everlena Cooper, Adam R,  DO  topiramate (TOPAMAX) 50 MG tablet TAKE 2 TABLETS(100 MG) BY MOUTH AT BEDTIME 06/26/20   [name], DO      Allergies    Other and Morphine and codeine    Review of Systems   Review of Systems  All other systems reviewed and are negative.   Physical Exam Updated Vital Signs BP (!) 156/109 (BP Location: Right Arm)   Pulse (!) 107   Temp 98 F (36.7 C)   Resp 20   Ht 5\' 3"  (1.6 m)   Wt 94.8 kg   SpO2 100%   BMI 37.02 kg/m  Physical Exam Vitals and nursing note reviewed.  Constitutional:      General: She is not in acute distress.    Appearance: Normal appearance.  HENT:     Head: Normocephalic and atraumatic.     Right Ear: External ear normal.     Left Ear: External ear normal.     Mouth/Throat:     Mouth: Mucous membranes are moist. No oral lesions or angioedema.     Tongue: Tongue does not deviate from midline.     Pharynx: Oropharynx is clear. Uvula midline. No uvula swelling.     Tonsils: No tonsillar abscesses.      Comments: Large dental caries with surrounding gingival erythema, no area  of fluctuance or induration, no sublingual, submandibular, or submental tenderness, swelling, fluctuance, or induration, widely patent airway, no signs of PTA/RPA, normal phonation Eyes:     Conjunctiva/sclera: Conjunctivae normal.  Cardiovascular:     Rate and Rhythm: Normal rate and regular rhythm.  Pulmonary:     Effort: Pulmonary effort is normal.     Breath sounds: Normal breath sounds.  Abdominal:     General: Abdomen is flat.     Palpations: Abdomen is soft.  Musculoskeletal:        General: Normal range of motion.     Cervical back: Neck supple.  Skin:    General: Skin is warm and dry.     Capillary Refill: Capillary refill takes less than 2 seconds.  Neurological:     Mental Status: She is alert. Mental status is at baseline.  Psychiatric:        Behavior: Behavior normal.     ED Results / Procedures / Treatments   Labs (all labs ordered are listed,  but only abnormal results are displayed) Labs Reviewed - No data to display  EKG None  Radiology No results found.  Procedures Procedures    Medications Ordered in ED Medications  ketorolac (TORADOL) 15 MG/ML injection 15 mg (has no administration in time range)    ED Course/ Medical Decision Making/ A&P                                 Medical Decision Making Risk Prescription drug management.   Medical Decision Making:   Christy Leblanc is a 52 y.o. female who presented to the ED today with dental pain detailed above.    Complete initial physical exam performed, notably the patient was HDS in no acute distress. No obvious intraoral lesions or swelling. Poor dentition diffusely    Reviewed and confirmed nursing documentation for past medical history, family history, social history.    Initial Assessment:   With the patient's presentation of dental pain, most likely diagnosis is reversible vs irreversible pulpitis. Other diagnoses were considered including (but not limited to) ludwig's angina, osteitis, dental abscess, PTA, RPA. These are considered less likely due to history of present illness and physical exam findings.   This is most consistent with an acute complicated illness  Initial Plan:  Symptomatic management with NSAIDS/Tylenol Due to clinical overlap with potentially reversible pulpitis, antibiotics prescribed Patient will need definitive management with dentistry. Provided low cost/local dental resource chart provided by social work.  Disposition:  I have considered need for hospitalization, however, considering all of the above, I believe this patient is stable for discharge at this time.  Patient/family educated about specific return precautions for given chief complaint and symptoms.  Patient/family educated about follow-up with PCP and dentistry.    Patient/family expressed understanding of return precautions and need for follow-up. Patient  spoken to regarding all imaging and laboratory results and appropriate follow up for these results. All education provided in verbal form with additional information in written form. Time was allowed for answering of patient questions. Patient discharged.              Final Clinical Impression(s) / ED Diagnoses Final diagnoses:  Pain, dental  Dental caries    Rx / DC Orders ED Discharge Orders          Ordered    amoxicillin-clavulanate (AUGMENTIN) 875-125 MG tablet  Every 12 hours  08/08/23 1609              Tonette Lederer, PA-C 08/08/23 1613    Glyn Ade, MD 08/08/23 [phone]

## 2023-08-09 ENCOUNTER — Emergency Department (HOSPITAL_COMMUNITY): Payer: BC Managed Care – PPO

## 2023-08-09 MED ORDER — DEXAMETHASONE SODIUM PHOSPHATE 10 MG/ML IJ SOLN
10.0000 mg | Freq: Once | INTRAMUSCULAR | Status: AC
  Administered 2023-08-09: 10 mg via INTRAVENOUS
  Filled 2023-08-09: qty 1

## 2023-08-09 MED ORDER — METOCLOPRAMIDE HCL 5 MG/ML IJ SOLN
10.0000 mg | Freq: Once | INTRAMUSCULAR | Status: AC
  Administered 2023-08-09: 10 mg via INTRAVENOUS
  Filled 2023-08-09: qty 2

## 2023-08-09 MED ORDER — LIDOCAINE VISCOUS HCL 2 % MT SOLN: 15.0000 mL | OROMUCOSAL | 0 refills | Status: AC | PRN

## 2023-08-09 MED ORDER — IOHEXOL 300 MG/ML  SOLN
75.0000 mL | Freq: Once | INTRAMUSCULAR | Status: AC | PRN
  Administered 2023-08-09: 75 mL via INTRAVENOUS

## 2023-08-09 NOTE — Discharge Instructions (Addendum)
You were evaluated today for dental pain.  Is important you follow-up with dentistry for definitive management.  Please take anti-inflammatories at home.  You may take the prescribed lidocaine as directed.  Please also take the earlier prescribed antibiotics.

## 2023-12-06 ENCOUNTER — Other Ambulatory Visit: Payer: Self-pay | Admitting: Obstetrics and Gynecology

## 2023-12-06 DIAGNOSIS — Z1231 Encounter for screening mammogram for malignant neoplasm of breast: Secondary | ICD-10-CM

## 2023-12-27 ENCOUNTER — Ambulatory Visit: Payer: Medicaid Other

## 2024-01-11 ENCOUNTER — Ambulatory Visit
Admission: RE | Admit: 2024-01-11 | Discharge: 2024-01-11 | Disposition: A | Discharge status: Home / Self Care | Payer: Medicaid Other | Source: Ambulatory Visit | Attending: Obstetrics and Gynecology | Admitting: Obstetrics and Gynecology

## 2024-01-11 DIAGNOSIS — Z1231 Encounter for screening mammogram for malignant neoplasm of breast: Secondary | ICD-10-CM

## 2024-09-09 ENCOUNTER — Ambulatory Visit: Admitting: Dermatology

## 2024-12-04 ENCOUNTER — Encounter: Payer: Self-pay | Admitting: Obstetrics and Gynecology

## 2024-12-04 DIAGNOSIS — Z1231 Encounter for screening mammogram for malignant neoplasm of breast: Secondary | ICD-10-CM

## 2025-03-31 ENCOUNTER — Ambulatory Visit: Admitting: Dermatology
